# Patient Record
Sex: Female | Born: 1960
Health system: Southern US, Community
[De-identification: ages and names within clinical notes are randomized; demographics above are authoritative.]

## PROBLEM LIST (undated history)

## (undated) DIAGNOSIS — R519 Headache, unspecified: Secondary | ICD-10-CM

## (undated) DIAGNOSIS — E7800 Pure hypercholesterolemia, unspecified: Secondary | ICD-10-CM

## (undated) DIAGNOSIS — G8929 Other chronic pain: Secondary | ICD-10-CM

## (undated) DIAGNOSIS — I729 Aneurysm of unspecified site: Secondary | ICD-10-CM

## (undated) DIAGNOSIS — R51 Headache: Secondary | ICD-10-CM

## (undated) DIAGNOSIS — E119 Type 2 diabetes mellitus without complications: Secondary | ICD-10-CM

## (undated) DIAGNOSIS — I10 Essential (primary) hypertension: Secondary | ICD-10-CM

## (undated) HISTORY — PX: TUBAL LIGATION: SHX77

## (undated) HISTORY — PX: DILATION AND CURETTAGE OF UTERUS: SHX78

---
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
## 1988-09-07 HISTORY — PX: CERVIX SURGERY: SHX593

## 2000-09-07 DIAGNOSIS — I729 Aneurysm of unspecified site: Secondary | ICD-10-CM

## 2000-09-07 HISTORY — DX: Aneurysm of unspecified site: I72.9

## 2000-09-07 HISTORY — PX: CEREBRAL ANEURYSM REPAIR: SHX164

## 2001-03-21 ENCOUNTER — Emergency Department (HOSPITAL_COMMUNITY): Admission: EM | Admit: 2001-03-21 | Discharge: 2001-03-22 | Payer: Self-pay | Admitting: *Deleted

## 2001-03-22 ENCOUNTER — Encounter: Payer: Self-pay | Admitting: *Deleted

## 2001-03-24 ENCOUNTER — Encounter: Payer: Self-pay | Admitting: Neurosurgery

## 2001-03-24 ENCOUNTER — Ambulatory Visit (HOSPITAL_COMMUNITY): Admission: RE | Admit: 2001-03-24 | Discharge: 2001-03-24 | Payer: Self-pay | Admitting: Neurosurgery

## 2001-03-30 ENCOUNTER — Ambulatory Visit (HOSPITAL_COMMUNITY): Admission: RE | Admit: 2001-03-30 | Discharge: 2001-03-30 | Payer: Self-pay | Admitting: Neurosurgery

## 2001-03-30 ENCOUNTER — Encounter: Payer: Self-pay | Admitting: Neurosurgery

## 2001-06-03 ENCOUNTER — Emergency Department (HOSPITAL_COMMUNITY): Admission: EM | Admit: 2001-06-03 | Discharge: 2001-06-04 | Payer: Self-pay | Admitting: *Deleted

## 2001-06-04 ENCOUNTER — Encounter: Payer: Self-pay | Admitting: *Deleted

## 2002-05-22 ENCOUNTER — Emergency Department (HOSPITAL_COMMUNITY): Admission: EM | Admit: 2002-05-22 | Discharge: 2002-05-22 | Payer: Self-pay | Admitting: Emergency Medicine

## 2002-05-22 ENCOUNTER — Encounter: Payer: Self-pay | Admitting: Emergency Medicine

## 2003-03-01 ENCOUNTER — Emergency Department (HOSPITAL_COMMUNITY): Admission: EM | Admit: 2003-03-01 | Discharge: 2003-03-01 | Payer: Self-pay | Admitting: Emergency Medicine

## 2003-09-08 ENCOUNTER — Emergency Department (HOSPITAL_COMMUNITY): Admission: EM | Admit: 2003-09-08 | Discharge: 2003-09-08 | Payer: Self-pay | Admitting: Emergency Medicine

## 2007-02-07 ENCOUNTER — Emergency Department (HOSPITAL_COMMUNITY): Admission: EM | Admit: 2007-02-07 | Discharge: 2007-02-07 | Payer: Self-pay | Admitting: Emergency Medicine

## 2007-04-22 ENCOUNTER — Emergency Department (HOSPITAL_COMMUNITY): Admission: EM | Admit: 2007-04-22 | Discharge: 2007-04-22 | Payer: Self-pay | Admitting: Emergency Medicine

## 2007-06-19 ENCOUNTER — Emergency Department (HOSPITAL_COMMUNITY): Admission: EM | Admit: 2007-06-19 | Discharge: 2007-06-19 | Payer: Self-pay | Admitting: Emergency Medicine

## 2007-09-08 ENCOUNTER — Emergency Department (HOSPITAL_COMMUNITY): Admission: EM | Admit: 2007-09-08 | Discharge: 2007-09-08 | Payer: Self-pay | Admitting: Emergency Medicine

## 2009-06-22 ENCOUNTER — Emergency Department (HOSPITAL_COMMUNITY): Admission: EM | Admit: 2009-06-22 | Discharge: 2009-06-22 | Payer: Self-pay | Admitting: Emergency Medicine

## 2010-06-16 ENCOUNTER — Emergency Department (HOSPITAL_COMMUNITY): Admission: EM | Admit: 2010-06-16 | Discharge: 2010-06-16 | Payer: Self-pay | Admitting: Emergency Medicine

## 2010-09-28 ENCOUNTER — Encounter: Payer: Self-pay | Admitting: Family Medicine

## 2010-11-19 LAB — CBC
HCT: 36.9 % (ref 36.0–46.0)
Hemoglobin: 12.4 g/dL (ref 12.0–15.0)
MCH: 27.1 pg (ref 26.0–34.0)
MCHC: 33.5 g/dL (ref 30.0–36.0)
MCV: 80.8 fL (ref 78.0–100.0)
Platelets: 238 10*3/uL (ref 150–400)
RBC: 4.57 MIL/uL (ref 3.87–5.11)
RDW: 15.1 % (ref 11.5–15.5)
WBC: 9.1 10*3/uL (ref 4.0–10.5)

## 2010-11-19 LAB — POCT CARDIAC MARKERS
CKMB, poc: <1 ng/mL — ABNORMAL LOW (ref 1.0–8.0)
Myoglobin, poc: 51.8 ng/mL (ref 12–200)
Troponin i, poc: <0.05 ng/mL (ref 0.00–0.09)

## 2010-11-19 LAB — DIFFERENTIAL
Basophils Absolute: 0 10*3/uL (ref 0.0–0.1)
Basophils Relative: 0 % (ref 0–1)
Eosinophils Absolute: 0.2 10*3/uL (ref 0.0–0.7)
Eosinophils Relative: 2 % (ref 0–5)
Lymphocytes Relative: 42 % (ref 12–46)
Lymphs Abs: 3.8 10*3/uL (ref 0.7–4.0)
Monocytes Absolute: 0.4 10*3/uL (ref 0.1–1.0)
Monocytes Relative: 5 % (ref 3–12)
Neutro Abs: 4.7 10*3/uL (ref 1.7–7.7)
Neutrophils Relative %: 51 % (ref 43–77)

## 2010-11-19 LAB — COMPREHENSIVE METABOLIC PANEL
ALT: 13 U/L (ref 0–35)
AST: 18 U/L (ref 0–37)
Albumin: 3.8 g/dL (ref 3.5–5.2)
Alkaline Phosphatase: 57 U/L (ref 39–117)
BUN: 7 mg/dL (ref 6–23)
CO2: 27 mEq/L (ref 19–32)
Calcium: 9.4 mg/dL (ref 8.4–10.5)
Chloride: 105 mEq/L (ref 96–112)
Creatinine, Ser: 0.83 mg/dL (ref 0.4–1.2)
GFR calc non Af Amer: >60 mL/min (ref >60)
Glucose, Bld: 117 mg/dL — ABNORMAL HIGH (ref 70–99)
Potassium: 3.4 mEq/L — ABNORMAL LOW (ref 3.5–5.1)
Sodium: 139 mEq/L (ref 135–145)
Total Bilirubin: 0.3 mg/dL (ref 0.3–1.2)
Total Protein: 7.1 g/dL (ref 6.0–8.3)

## 2010-11-19 LAB — LIPASE, BLOOD: Lipase: 45 U/L (ref 11–59)

## 2011-07-21 ENCOUNTER — Emergency Department (HOSPITAL_COMMUNITY): Payer: BC Managed Care – PPO

## 2011-07-21 ENCOUNTER — Emergency Department (HOSPITAL_COMMUNITY)
Admission: EM | Admit: 2011-07-21 | Discharge: 2011-07-22 | Disposition: A | Discharge status: Home / Self Care | Payer: BC Managed Care – PPO | Attending: Emergency Medicine | Admitting: Emergency Medicine

## 2011-07-21 ENCOUNTER — Other Ambulatory Visit: Payer: Self-pay

## 2011-07-21 ENCOUNTER — Encounter: Payer: Self-pay | Admitting: *Deleted

## 2011-07-21 DIAGNOSIS — Z79899 Other long term (current) drug therapy: Secondary | ICD-10-CM | POA: Insufficient documentation

## 2011-07-21 DIAGNOSIS — R05 Cough: Secondary | ICD-10-CM | POA: Insufficient documentation

## 2011-07-21 DIAGNOSIS — R51 Headache: Secondary | ICD-10-CM

## 2011-07-21 DIAGNOSIS — I10 Essential (primary) hypertension: Secondary | ICD-10-CM | POA: Insufficient documentation

## 2011-07-21 DIAGNOSIS — R059 Cough, unspecified: Secondary | ICD-10-CM | POA: Insufficient documentation

## 2011-07-21 DIAGNOSIS — R079 Chest pain, unspecified: Secondary | ICD-10-CM | POA: Insufficient documentation

## 2011-07-21 DIAGNOSIS — R42 Dizziness and giddiness: Secondary | ICD-10-CM | POA: Insufficient documentation

## 2011-07-21 HISTORY — DX: Aneurysm of unspecified site: I72.9

## 2011-07-21 HISTORY — DX: Essential (primary) hypertension: I10

## 2011-07-21 LAB — CBC
HCT: 36 % (ref 36.0–46.0)
Hemoglobin: 11.9 g/dL — ABNORMAL LOW (ref 12.0–15.0)
MCH: 26.6 pg (ref 26.0–34.0)
MCHC: 33.1 g/dL (ref 30.0–36.0)
MCV: 80.5 fL (ref 78.0–100.0)
Platelets: 278 10*3/uL (ref 150–400)
RBC: 4.47 MIL/uL (ref 3.87–5.11)
RDW: 14.5 % (ref 11.5–15.5)
WBC: 8.8 10*3/uL (ref 4.0–10.5)

## 2011-07-21 LAB — POCT I-STAT TROPONIN I: Troponin i, poc: 0 ng/mL (ref 0.00–0.08)

## 2011-07-21 LAB — HEPATIC FUNCTION PANEL
ALT: 9 U/L (ref 0–35)
AST: 18 U/L (ref 0–37)
Albumin: 3.6 g/dL (ref 3.5–5.2)
Alkaline Phosphatase: 77 U/L (ref 39–117)
Bilirubin, Direct: <0.1 mg/dL (ref 0.0–0.3)
Total Bilirubin: 0.2 mg/dL — ABNORMAL LOW (ref 0.3–1.2)
Total Protein: 7.5 g/dL (ref 6.0–8.3)

## 2011-07-21 LAB — BASIC METABOLIC PANEL
BUN: 11 mg/dL (ref 6–23)
CO2: 27 mEq/L (ref 19–32)
Calcium: 9.8 mg/dL (ref 8.4–10.5)
Chloride: 108 mEq/L (ref 96–112)
Creatinine, Ser: 0.78 mg/dL (ref 0.50–1.10)
GFR calc Af Amer: >90 mL/min (ref >90)
GFR calc non Af Amer: >90 mL/min (ref >90)
Glucose, Bld: 107 mg/dL — ABNORMAL HIGH (ref 70–99)
Potassium: 4.3 mEq/L (ref 3.5–5.1)
Sodium: 143 mEq/L (ref 135–145)

## 2011-07-21 MED ORDER — DIPHENHYDRAMINE HCL 25 MG PO CAPS
50.0000 mg | ORAL_CAPSULE | Freq: Once | ORAL | Status: AC
  Administered 2011-07-21: 50 mg via ORAL
  Filled 2011-07-21: qty 1

## 2011-07-21 MED ORDER — IBUPROFEN 200 MG PO TABS
600.0000 mg | ORAL_TABLET | Freq: Once | ORAL | Status: AC
  Administered 2011-07-21: 600 mg via ORAL
  Filled 2011-07-21: qty 3

## 2011-07-21 MED ORDER — METOCLOPRAMIDE HCL 10 MG PO TABS
10.0000 mg | ORAL_TABLET | ORAL | Status: AC
  Administered 2011-07-21: 10 mg via ORAL
  Filled 2011-07-21: qty 1

## 2011-07-21 NOTE — ED Notes (Signed)
To ed for eval of left side cp for the past cple of weeks. Poor historian. States it started as a sharp pain and now feels like a 'brick'. C/o productive cough. No fevers. Skin w/d, resp e/u. No difficulty with sleeping at night.

## 2011-07-21 NOTE — ED Provider Notes (Signed)
History     CSN: [id number] Arrival date & time: 07/21/2011  2:02 PM   First MD Initiated Contact with Patient 07/21/11 1832      Chief Complaint  Patient presents with  . Chest Pain    (Consider location/radiation/quality/duration/timing/severity/associated sxs/prior treatment) HPI Comments: Patient presented to the ED for chief complaint of left-sided chest pain that been going on for 2 weeks. She states the pain is sharp. She also complains of some lightheadedness and headache.. She denies any change in her vision, new onset of weakness, shortness of breath, DOE, PND, orthopnea. Patient denies hemoptysis, recent travel, recent surgery, and estrogen use.  The history is provided by the patient.    Past Medical History  Diagnosis Date  . Hypertension     History reviewed. No pertinent past surgical history.  History reviewed. No pertinent family history.  History  Substance Use Topics  . Smoking status: Not on file  . Smokeless tobacco: Not on file  . Alcohol Use: No    OB History    Grav Para Term Preterm Abortions TAB SAB Ect Mult Living                  Review of Systems  Constitutional: Negative for fever, chills and appetite change.  HENT: Negative for congestion, neck pain, neck stiffness and sinus pressure.   Eyes: Negative for photophobia, pain and visual disturbance.  Respiratory: Positive for cough. Negative for apnea, chest tightness, shortness of breath, wheezing and stridor.   Cardiovascular: Negative for chest pain, palpitations and leg swelling.  Gastrointestinal: Negative for nausea and abdominal pain.  Genitourinary: Negative for dysuria, urgency and frequency.  Skin: Negative for rash.  Neurological: Positive for light-headedness and headaches. Negative for dizziness, seizures, syncope, facial asymmetry, speech difficulty, weakness and numbness.  Psychiatric/Behavioral: Negative for confusion.    Allergies  Review of patient's allergies  indicates no known allergies.  Home Medications   Current Outpatient Rx  Name Route Sig Dispense Refill  . LISINOPRIL 20 MG PO TABS Oral Take 20 mg by mouth daily.        BP 157/117  Pulse 91  Temp(Src) 98.2 F (36.8 C) (Oral)  Resp 16  SpO2 98%  Physical Exam  Constitutional: She is oriented to person, place, and time. She appears well-developed and well-nourished. No distress.  HENT:  Head: Normocephalic and atraumatic.  Mouth/Throat: Oropharynx is clear and moist. No oropharyngeal exudate.  Eyes: Conjunctivae and EOM are normal. Pupils are equal, round, and reactive to light. No scleral icterus.  Neck: Normal range of motion. Neck supple. No tracheal deviation present. No thyromegaly present.  Cardiovascular: Normal rate, regular rhythm, normal heart sounds and intact distal pulses.   Pulmonary/Chest: Effort normal and breath sounds normal. No stridor.  Abdominal: Soft. Bowel sounds are normal.  Musculoskeletal: Normal range of motion. She exhibits no edema and no tenderness.  Neurological: She is alert and oriented to person, place, and time. She has normal strength and normal reflexes. She displays no atrophy and no tremor. No cranial nerve deficit or sensory deficit. She exhibits normal muscle tone. She displays a negative Romberg sign. She displays no seizure activity. Coordination normal.  Skin: Skin is warm and dry. No rash noted. She is not diaphoretic. No erythema. No pallor.  Psychiatric: She has a normal mood and affect. Her behavior is normal.    ED Course  Procedures (including critical care time)  Labs Reviewed  CBC - Abnormal; Notable for the following:  Hemoglobin 11.9 (*)    All other components within normal limits  BASIC METABOLIC PANEL - Abnormal; Notable for the following:    Glucose, Bld 107 (*)    All other components within normal limits  POCT I-STAT TROPONIN I  I-STAT TROPONIN I  HEPATIC FUNCTION PANEL   Dg Chest 2 View  07/21/2011   *RADIOLOGY REPORT*  Clinical Data: Left-sided chest pain  CHEST - 2 VIEW  Comparison: Chest x-ray of 06/22/2009, and chest x-ray of 09/08/2007  Findings: No active infiltrate or effusion is seen. The nodular density overlying the anterior left first costochondral junction appears stable and bony in origin.  Mediastinal contours are stable.  The heart is within normal limits in size.  No bony abnormality seen.  IMPRESSION: No active lung disease.  Original Report Authenticated By: Juline Patch, M.D.     No diagnosis found. Medical screening exam. Patient has had chest pain for a couple weeks. It comes and goes. She's also had occasional cough. She also has right upper quadrant abdominal pain. She does not know if the abdominal pain is worse with food or not. She also has a headache. She states it is right-sided headache. It is not her typical headache. She states she's had headaches on and off since an aneurysm in 2002. sHe states it was fixed at [facility]. It is not a severe headache.   Date: 07/21/2011  Rate:84  Rhythm: normal sinus rhythm  QRS Axis: normal  Intervals: normal  ST/T Wave abnormalities: normal  Conduction Disutrbances:none  Narrative Interpretation:   Old EKG Reviewed: none available  Of note patient care was assumed from Dr. Rubin Payor. Patient was already in the emergency department for 7 before she was handed over to my care. Patient currently does have frustrations with her time in the ED however she is not currently agitated.  10:16 PM  CT IMPRESSION:  Stable sequelae of ACA region aneurysm clipping. Stable and  otherwise negative noncontrast CT appearance of the head.  Original Report Authenticated By: Harley Hallmark, M.D.  Patient results discussed with Dr. Clarene Duke. She agrees with the plan to discharge patient with followup. Of note the patient was able to and around the emergency department with a normal gait. She states she feels slightly lightheaded but understands  her results were all negative and she will followup with her primary care doctor.   MDM  Headache Light headedness Smoking cessation          [place], [place] 07/22/11 0023

## 2011-07-21 NOTE — ED Notes (Signed)
Pt given sprite soda and crakers

## 2011-07-22 LAB — D-DIMER, QUANTITATIVE: D-Dimer, Quant: 0.48 ug/mL-FEU (ref 0.00–0.48)

## 2011-07-22 NOTE — ED Provider Notes (Signed)
Medical screening examination/treatment/procedure(s) were performed by non-physician practitioner and as supervising physician I was immediately available for consultation/collaboration.   Tenea Sens M Dianna Ewald, DO 07/22/11 0043 

## 2011-12-05 ENCOUNTER — Encounter (HOSPITAL_COMMUNITY): Payer: Self-pay | Admitting: *Deleted

## 2011-12-05 ENCOUNTER — Emergency Department (HOSPITAL_COMMUNITY)
Admission: EM | Admit: 2011-12-05 | Discharge: 2011-12-05 | Disposition: A | Discharge status: Home / Self Care | Payer: BC Managed Care – PPO | Attending: Emergency Medicine | Admitting: Emergency Medicine

## 2011-12-05 DIAGNOSIS — Z79899 Other long term (current) drug therapy: Secondary | ICD-10-CM | POA: Insufficient documentation

## 2011-12-05 DIAGNOSIS — R51 Headache: Secondary | ICD-10-CM | POA: Insufficient documentation

## 2011-12-05 DIAGNOSIS — I10 Essential (primary) hypertension: Secondary | ICD-10-CM | POA: Insufficient documentation

## 2011-12-05 LAB — COMPREHENSIVE METABOLIC PANEL
ALT: 11 U/L (ref 0–35)
AST: 14 U/L (ref 0–37)
Albumin: 3.9 g/dL (ref 3.5–5.2)
Alkaline Phosphatase: 80 U/L (ref 39–117)
BUN: 10 mg/dL (ref 6–23)
CO2: 27 mEq/L (ref 19–32)
Calcium: 9.9 mg/dL (ref 8.4–10.5)
Chloride: 103 mEq/L (ref 96–112)
Creatinine, Ser: 0.82 mg/dL (ref 0.50–1.10)
GFR calc Af Amer: >90 mL/min (ref >90)
GFR calc non Af Amer: 83 mL/min — ABNORMAL LOW (ref >90)
Glucose, Bld: 127 mg/dL — ABNORMAL HIGH (ref 70–99)
Potassium: 3.6 mEq/L (ref 3.5–5.1)
Sodium: 139 mEq/L (ref 135–145)
Total Bilirubin: 0.2 mg/dL — ABNORMAL LOW (ref 0.3–1.2)
Total Protein: 7.7 g/dL (ref 6.0–8.3)

## 2011-12-05 LAB — DIFFERENTIAL
Basophils Absolute: 0 10*3/uL (ref 0.0–0.1)
Basophils Relative: 0 % (ref 0–1)
Eosinophils Absolute: 0.1 10*3/uL (ref 0.0–0.7)
Eosinophils Relative: 1 % (ref 0–5)
Lymphocytes Relative: 31 % (ref 12–46)
Lymphs Abs: 3 10*3/uL (ref 0.7–4.0)
Monocytes Absolute: 0.4 10*3/uL (ref 0.1–1.0)
Monocytes Relative: 4 % (ref 3–12)
Neutro Abs: 6.1 10*3/uL (ref 1.7–7.7)
Neutrophils Relative %: 63 % (ref 43–77)

## 2011-12-05 LAB — CBC
HCT: 37.5 % (ref 36.0–46.0)
Hemoglobin: 12.4 g/dL (ref 12.0–15.0)
MCH: 26.3 pg (ref 26.0–34.0)
MCHC: 33.1 g/dL (ref 30.0–36.0)
MCV: 79.6 fL (ref 78.0–100.0)
Platelets: 234 10*3/uL (ref 150–400)
RBC: 4.71 MIL/uL (ref 3.87–5.11)
RDW: 14.9 % (ref 11.5–15.5)
WBC: 9.6 10*3/uL (ref 4.0–10.5)

## 2011-12-05 LAB — LIPASE, BLOOD: Lipase: 38 U/L (ref 11–59)

## 2011-12-05 MED ORDER — RANITIDINE HCL 150 MG PO CAPS: 150.0000 mg | ORAL_CAPSULE | Freq: Two times a day (BID) | ORAL | Status: DC

## 2011-12-05 MED ORDER — PROMETHAZINE HCL 25 MG PO TABS: 25.0000 mg | ORAL_TABLET | Freq: Four times a day (QID) | ORAL | Status: DC | PRN

## 2011-12-05 MED ORDER — OXYCODONE-ACETAMINOPHEN 5-325 MG PO TABS: 1.0000 | ORAL_TABLET | Freq: Four times a day (QID) | ORAL | Status: AC | PRN

## 2011-12-05 MED ORDER — HYDROMORPHONE HCL PF 1 MG/ML IJ SOLN
1.0000 mg | Freq: Once | INTRAMUSCULAR | Status: AC
  Administered 2011-12-05: 1 mg via INTRAVENOUS
  Filled 2011-12-05: qty 1

## 2011-12-05 MED ORDER — ONDANSETRON HCL 4 MG/2ML IJ SOLN
4.0000 mg | Freq: Once | INTRAMUSCULAR | Status: AC
  Administered 2011-12-05: 4 mg via INTRAVENOUS
  Filled 2011-12-05: qty 2

## 2011-12-05 MED ORDER — SODIUM CHLORIDE 0.9 % IV SOLN
Freq: Once | INTRAVENOUS | Status: AC
  Administered 2011-12-05: 14:00:00 via INTRAVENOUS

## 2011-12-05 NOTE — Discharge Instructions (Signed)
Follow up next week if not improving.  Drink plenty of fluids °

## 2011-12-05 NOTE — ED Notes (Signed)
MD at bedside. 

## 2011-12-05 NOTE — ED Notes (Signed)
Pt c/o headache, nausea, upper abdominal pain and dizziness since last night. Denies vomiting, diarrhea or fever.

## 2011-12-05 NOTE — ED Provider Notes (Signed)
History     CSN: [id number]  Arrival date & time 12/05/11  1248   First MD Initiated Contact with Patient 12/05/11 1307      Chief Complaint  Patient presents with  . Headache    (Consider location/radiation/quality/duration/timing/severity/associated sxs/prior treatment) Patient is a 51 y.o. female presenting with headaches. The history is provided by the patient (The patient complains of a headache nausea and some epigastric pain. This has been going on for one to 2 days.). No language interpreter was used.  Headache  This is a new problem. The current episode started 6 to 12 hours ago. The problem occurs constantly. The problem has not changed since onset.The headache is associated with nothing. The pain is located in the bilateral region. The quality of the pain is described as dull. The pain is at a severity of 3/10. The pain is moderate. The pain does not radiate. Associated symptoms include anorexia and nausea. The treatment provided moderate relief.    Past Medical History  Diagnosis Date  . Hypertension   . Aneurysm 07/21/11    coil / clip    Past Surgical History  Procedure Date  . Cerebral aneurysm repair     History reviewed. No pertinent family history.  History  Substance Use Topics  . Smoking status: Current Everyday Smoker -- 0.5 packs/day    Types: Cigarettes  . Smokeless tobacco: Not on file  . Alcohol Use: No    OB History    Grav Para Term Preterm Abortions TAB SAB Ect Mult Living                  Review of Systems  Constitutional: Negative for fatigue.  HENT: Negative for congestion, sinus pressure and ear discharge.   Eyes: Negative for discharge.  Respiratory: Negative for cough.   Cardiovascular: Negative for chest pain.  Gastrointestinal: Positive for nausea, abdominal pain and anorexia. Negative for diarrhea.  Genitourinary: Negative for frequency and hematuria.  Musculoskeletal: Negative for back pain.  Skin: Negative for rash.    Neurological: Positive for headaches. Negative for seizures.  Hematological: Negative.   Psychiatric/Behavioral: Negative for hallucinations.    Allergies  Review of patient's allergies indicates no known allergies.  Home Medications   Current Outpatient Rx  Name Route Sig Dispense Refill  . DIPHENHYDRAMINE-APAP (SLEEP) 25-500 MG PO TABS Oral Take 2 tablets by mouth at bedtime as needed. For pain    . GENISTEIN 30 MG PO TABS Oral Take 1 tablet by mouth as needed. I-Cool.  Takes occasionally for hot flashes    . LISINOPRIL 20 MG PO TABS Oral Take 20 mg by mouth daily.      Marland Kitchen OVER THE COUNTER MEDICATION Oral Take 1 tablet by mouth as needed. OTC medication: Move Right for joint pain    . OXYCODONE-ACETAMINOPHEN 5-325 MG PO TABS Oral Take 1 tablet by mouth every 6 (six) hours as needed for pain. 15 tablet 0  . PROMETHAZINE HCL 25 MG PO TABS Oral Take 1 tablet (25 mg total) by mouth every 6 (six) hours as needed for nausea. 15 tablet 0  . RANITIDINE HCL 150 MG PO CAPS Oral Take 1 capsule (150 mg total) by mouth 2 (two) times daily. 30 capsule 0    BP 146/94  Pulse 102  Temp(Src) 98.9 F (37.2 C) (Oral)  Resp 16  Ht 5' 7.5" (1.715 m)  Wt 210 lb (95.255 kg)  BMI 32.41 kg/m2  SpO2 99%  Physical Exam  Constitutional: She is  oriented to person, place, and time. She appears well-developed.  HENT:  Head: Normocephalic and atraumatic.  Eyes: Conjunctivae and EOM are normal. No scleral icterus.  Neck: Neck supple. No thyromegaly present.  Cardiovascular: Normal rate and regular rhythm.  Exam reveals no gallop and no friction rub.   No murmur heard. Pulmonary/Chest: No stridor. She has no wheezes. She has no rales. She exhibits no tenderness.  Abdominal: She exhibits no distension. There is tenderness. There is no rebound.       Mild tendernous epigastric  Musculoskeletal: Normal range of motion. She exhibits no edema.  Lymphadenopathy:    She has no cervical adenopathy.   Neurological: She is oriented to person, place, and time. Coordination normal.  Skin: No rash noted. No erythema.  Psychiatric: She has a normal mood and affect. Her behavior is normal.    ED Course  Procedures (including critical care time)  Labs Reviewed  COMPREHENSIVE METABOLIC PANEL - Abnormal; Notable for the following:    Glucose, Bld 127 (*)    Total Bilirubin 0.2 (*)    GFR calc non Af Amer 83 (*)    All other components within normal limits  CBC  DIFFERENTIAL  LIPASE, BLOOD   No results found.   1. Headache       MDM  Headache and nausea  Possible viral syndrome.  Pt to follow up this week        Benny Lennert, MD 12/05/11 1546

## 2011-12-05 NOTE — ED Notes (Signed)
Pt Dc to home with steady gait. 

## 2012-01-13 ENCOUNTER — Encounter (HOSPITAL_COMMUNITY): Payer: Self-pay

## 2012-01-13 ENCOUNTER — Encounter (HOSPITAL_COMMUNITY)
Admission: RE | Admit: 2012-01-13 | Discharge: 2012-01-13 | Disposition: A | Discharge status: Home / Self Care | Payer: BC Managed Care – PPO | Source: Ambulatory Visit | Attending: General Surgery | Admitting: General Surgery

## 2012-01-13 LAB — CBC
HCT: 37 % (ref 36.0–46.0)
Hemoglobin: 11.9 g/dL — ABNORMAL LOW (ref 12.0–15.0)
MCH: 26.3 pg (ref 26.0–34.0)
MCHC: 32.2 g/dL (ref 30.0–36.0)
MCV: 81.7 fL (ref 78.0–100.0)
Platelets: 269 10*3/uL (ref 150–400)
RBC: 4.53 MIL/uL (ref 3.87–5.11)
RDW: 15.1 % (ref 11.5–15.5)
WBC: 6.3 10*3/uL (ref 4.0–10.5)

## 2012-01-13 LAB — DIFFERENTIAL
Basophils Absolute: 0 10*3/uL (ref 0.0–0.1)
Basophils Relative: 0 % (ref 0–1)
Eosinophils Absolute: 0.2 10*3/uL (ref 0.0–0.7)
Eosinophils Relative: 4 % (ref 0–5)
Lymphocytes Relative: 58 % — ABNORMAL HIGH (ref 12–46)
Lymphs Abs: 3.6 10*3/uL (ref 0.7–4.0)
Monocytes Absolute: 0.2 10*3/uL (ref 0.1–1.0)
Monocytes Relative: 4 % (ref 3–12)
Neutro Abs: 2.2 10*3/uL (ref 1.7–7.7)
Neutrophils Relative %: 34 % — ABNORMAL LOW (ref 43–77)

## 2012-01-13 LAB — BASIC METABOLIC PANEL
BUN: 7 mg/dL (ref 6–23)
CO2: 27 mEq/L (ref 19–32)
Calcium: 9.8 mg/dL (ref 8.4–10.5)
Chloride: 106 mEq/L (ref 96–112)
Creatinine, Ser: 0.73 mg/dL (ref 0.50–1.10)
GFR calc Af Amer: >90 mL/min (ref >90)
GFR calc non Af Amer: >90 mL/min (ref >90)
Glucose, Bld: 137 mg/dL — ABNORMAL HIGH (ref 70–99)
Potassium: 4 mEq/L (ref 3.5–5.1)
Sodium: 142 mEq/L (ref 135–145)

## 2012-01-13 LAB — SURGICAL PCR SCREEN
MRSA, PCR: NEGATIVE
Staphylococcus aureus: NEGATIVE

## 2012-01-13 LAB — HCG, SERUM, QUALITATIVE: Preg, Serum: NEGATIVE

## 2012-01-13 NOTE — Patient Instructions (Signed)
20 Denise Hill  01/13/2012   Your procedure is scheduled on:  01/18/2012  Report to [facility] at  900  AM.  Call this number if you have problems the morning of surgery: [phone]   Remember:   Do not eat food:After Midnight.  May have clear liquids:until Midnight .  Clear liquids include soda, tea, black coffee, apple or grape juice, broth.  Take these medicines the morning of surgery with A SIP OF WATER:  Lisinopril,zantac,phenergan   Do not wear jewelry, make-up or nail polish.  Do not wear lotions, powders, or perfumes. You may wear deodorant.  Do not shave 48 hours prior to surgery.  Do not bring valuables to the hospital.  Contacts, dentures or bridgework may not be worn into surgery.  Leave suitcase in the car. After surgery it may be brought to your room.  For patients admitted to the hospital, checkout time is 11:00 AM the day of discharge.   Patients discharged the day of surgery will not be allowed to drive home.  Name and phone number of your driver: family  Special Instructions: CHG Shower Use Special Wash: 1/2 bottle night before surgery and 1/2 bottle morning of surgery.   Please read over the following fact sheets that you were given: Pain Booklet, MRSA Information, Surgical Site Infection Prevention, Anesthesia Post-op Instructions and Care and Recovery After Surgery Laparoscopic Cholecystectomy Laparoscopic cholecystectomy is surgery to remove the gallbladder. The gallbladder is located slightly to the right of center in the abdomen, behind the liver. It is a concentrating and storage sac for the bile produced in the liver. Bile aids in the digestion and absorption of fats. Gallbladder disease (cholecystitis) is an inflammation of your gallbladder. This condition is usually caused by a buildup of gallstones (cholelithiasis) in your gallbladder. Gallstones can block the flow of bile, resulting in inflammation and pain. In severe cases, emergency surgery may be required.  When emergency surgery is not required, you will have time to prepare for the procedure. Laparoscopic surgery is an alternative to open surgery. Laparoscopic surgery usually has a shorter recovery time. Your common bile duct may also need to be examined and explored. Your caregiver will discuss this with you if he or she feels this should be done. If stones are found in the common bile duct, they may be removed. LET YOUR CAREGIVER KNOW ABOUT:  Allergies to food or medicine.   Medicines taken, including vitamins, herbs, eyedrops, over-the-counter medicines, and creams.   Use of steroids (by mouth or creams).   Previous problems with anesthetics or numbing medicines.   History of bleeding problems or blood clots.   Previous surgery.   Other health problems, including diabetes and kidney problems.   Possibility of pregnancy, if this applies.  RISKS AND COMPLICATIONS All surgery is associated with risks. Some problems that may occur following this procedure include:  Infection.   Damage to the common bile duct, nerves, arteries, veins, or other internal organs such as the stomach or intestines.   Bleeding.   A stone may remain in the common bile duct.  BEFORE THE PROCEDURE  Do not take aspirin for 3 days prior to surgery or blood thinners for 1 week prior to surgery.   Do not eat or drink anything after midnight the night before surgery.   Let your caregiver know if you develop a cold or other infectious problem prior to surgery.   You should be present 60 minutes before the procedure or as directed.  PROCEDURE  You will be given medicine that makes you sleep (general anesthetic). When you are asleep, your surgeon will make several small cuts (incisions) in your abdomen. One of these incisions is used to insert a small, lighted scope (laparoscope) into the abdomen. The laparoscope helps the surgeon see into your abdomen. Carbon dioxide gas will be pumped into your abdomen. The gas  allows more room for the surgeon to perform your surgery. Other operating instruments are inserted through the other incisions. Laparoscopic procedures may not be appropriate when:  There is major scarring from previous surgery.   The gallbladder is extremely inflamed.   There are bleeding disorders or unexpected cirrhosis of the liver.   A pregnancy is near term.   Other conditions make the laparoscopic procedure impossible.  If your surgeon feels it is not safe to continue with a laparoscopic procedure, he or she will perform an open abdominal procedure. In this case, the surgeon will make an incision to open the abdomen. This gives the surgeon a larger view and field to work within. This may allow the surgeon to perform procedures that sometimes cannot be performed with a laparoscope alone. Open surgery has a longer recovery time. AFTER THE PROCEDURE  You will be taken to the recovery area where a nurse will watch and check your progress.   You may be allowed to go home the same day.   Do not resume physical activities until directed by your caregiver.   You may resume a normal diet and activities as directed.  Document Released: 08/24/2005 Document Revised: 08/13/2011 Document Reviewed: 02/06/2011 [facility] Patient Information 2012 [place], [place].PATIENT INSTRUCTIONS POST-ANESTHESIA  IMMEDIATELY FOLLOWING SURGERY:  Do not drive or operate machinery for the first twenty four hours after surgery.  Do not make any important decisions for twenty four hours after surgery or while taking narcotic pain medications or sedatives.  If you develop intractable nausea and vomiting or a severe headache please notify your doctor immediately.  FOLLOW-UP:  Please make an appointment with your surgeon as instructed. You do not need to follow up with anesthesia unless specifically instructed to do so.  WOUND CARE INSTRUCTIONS (if applicable):  Keep a dry clean dressing on the anesthesia/puncture wound  site if there is drainage.  Once the wound has quit draining you may leave it open to air.  Generally you should leave the bandage intact for twenty four hours unless there is drainage.  If the epidural site drains for more than 36-48 hours please call the anesthesia department.  QUESTIONS?:  Please feel free to call your physician or the hospital operator if you have any questions, and they will be happy to assist you.     [facility] Anesthesia Department [address] [place] [phone]

## 2012-01-18 ENCOUNTER — Ambulatory Visit (HOSPITAL_COMMUNITY): Payer: BC Managed Care – PPO | Admitting: Anesthesiology

## 2012-01-18 ENCOUNTER — Encounter (HOSPITAL_COMMUNITY)
Admission: RE | Disposition: A | Discharge status: Home / Self Care | Payer: Self-pay | Source: Ambulatory Visit | Attending: General Surgery

## 2012-01-18 ENCOUNTER — Encounter (HOSPITAL_COMMUNITY): Payer: Self-pay | Admitting: Anesthesiology

## 2012-01-18 ENCOUNTER — Ambulatory Visit (HOSPITAL_COMMUNITY)
Admission: RE | Admit: 2012-01-18 | Discharge: 2012-01-18 | Disposition: A | Discharge status: Home / Self Care | Payer: BC Managed Care – PPO | Source: Ambulatory Visit | Attending: General Surgery | Admitting: General Surgery

## 2012-01-18 ENCOUNTER — Encounter (HOSPITAL_COMMUNITY): Payer: Self-pay | Admitting: *Deleted

## 2012-01-18 DIAGNOSIS — Z79899 Other long term (current) drug therapy: Secondary | ICD-10-CM | POA: Insufficient documentation

## 2012-01-18 DIAGNOSIS — K811 Chronic cholecystitis: Secondary | ICD-10-CM | POA: Insufficient documentation

## 2012-01-18 DIAGNOSIS — Z01812 Encounter for preprocedural laboratory examination: Secondary | ICD-10-CM | POA: Insufficient documentation

## 2012-01-18 DIAGNOSIS — Z01810 Encounter for preprocedural cardiovascular examination: Secondary | ICD-10-CM | POA: Insufficient documentation

## 2012-01-18 DIAGNOSIS — I10 Essential (primary) hypertension: Secondary | ICD-10-CM | POA: Insufficient documentation

## 2012-01-18 HISTORY — PX: CHOLECYSTECTOMY: SHX55

## 2012-01-18 LAB — GLUCOSE, CAPILLARY: Glucose-Capillary: 99 mg/dL (ref 70–99)

## 2012-01-18 SURGERY — LAPAROSCOPIC CHOLECYSTECTOMY
Anesthesia: General | Site: Abdomen | Wound class: Contaminated

## 2012-01-18 MED ORDER — BUPIVACAINE HCL (PF) 0.5 % IJ SOLN
INTRAMUSCULAR | Status: AC
  Filled 2012-01-18: qty 30

## 2012-01-18 MED ORDER — CEFAZOLIN SODIUM-DEXTROSE 2-3 GM-% IV SOLR: 2.0000 g | INTRAVENOUS | Status: DC

## 2012-01-18 MED ORDER — GLYCOPYRROLATE 0.2 MG/ML IJ SOLN
INTRAMUSCULAR | Status: DC | PRN
  Administered 2012-01-18: 0.6 mg via INTRAVENOUS

## 2012-01-18 MED ORDER — CEFAZOLIN SODIUM 1-5 GM-% IV SOLN
INTRAVENOUS | Status: AC
  Filled 2012-01-18: qty 50

## 2012-01-18 MED ORDER — FENTANYL CITRATE 0.05 MG/ML IJ SOLN
25.0000 ug | INTRAMUSCULAR | Status: DC | PRN
  Administered 2012-01-18 (×2): 50 ug via INTRAVENOUS

## 2012-01-18 MED ORDER — HYDROCODONE-ACETAMINOPHEN 5-325 MG PO TABS: 1.0000 | ORAL_TABLET | ORAL | Status: AC | PRN

## 2012-01-18 MED ORDER — BUPIVACAINE HCL (PF) 0.5 % IJ SOLN
INTRAMUSCULAR | Status: DC | PRN
  Administered 2012-01-18: 10 mL

## 2012-01-18 MED ORDER — SODIUM CHLORIDE 0.9 % IR SOLN
Status: DC | PRN
  Administered 2012-01-18: 3000 mL

## 2012-01-18 MED ORDER — ROCURONIUM BROMIDE 100 MG/10ML IV SOLN
INTRAVENOUS | Status: DC | PRN
  Administered 2012-01-18: 20 mg via INTRAVENOUS

## 2012-01-18 MED ORDER — CEFAZOLIN SODIUM 1-5 GM-% IV SOLN
INTRAVENOUS | Status: DC | PRN
  Administered 2012-01-18: 2 g via INTRAVENOUS

## 2012-01-18 MED ORDER — ONDANSETRON HCL 4 MG/2ML IJ SOLN
INTRAMUSCULAR | Status: AC
  Administered 2012-01-18: 4 mg via INTRAVENOUS
  Filled 2012-01-18: qty 2

## 2012-01-18 MED ORDER — HEMOSTATIC AGENTS (NO CHARGE) OPTIME
TOPICAL | Status: DC | PRN
  Administered 2012-01-18: 1 via TOPICAL

## 2012-01-18 MED ORDER — CELECOXIB 100 MG PO CAPS
ORAL_CAPSULE | ORAL | Status: AC
  Administered 2012-01-18: 400 mg via ORAL
  Filled 2012-01-18: qty 4

## 2012-01-18 MED ORDER — GLYCOPYRROLATE 0.2 MG/ML IJ SOLN
INTRAMUSCULAR | Status: AC
  Administered 2012-01-18: 0.2 mg via INTRAVENOUS
  Filled 2012-01-18: qty 1

## 2012-01-18 MED ORDER — NEOSTIGMINE METHYLSULFATE 1 MG/ML IJ SOLN
INTRAMUSCULAR | Status: DC | PRN
  Administered 2012-01-18: 4 mg via INTRAVENOUS

## 2012-01-18 MED ORDER — CELECOXIB 100 MG PO CAPS
400.0000 mg | ORAL_CAPSULE | Freq: Every day | ORAL | Status: AC
  Administered 2012-01-18: 400 mg via ORAL

## 2012-01-18 MED ORDER — SUCCINYLCHOLINE CHLORIDE 20 MG/ML IJ SOLN
INTRAMUSCULAR | Status: DC | PRN
  Administered 2012-01-18: 120 mg via INTRAVENOUS

## 2012-01-18 MED ORDER — MIDAZOLAM HCL 2 MG/2ML IJ SOLN
1.0000 mg | INTRAMUSCULAR | Status: DC | PRN
  Administered 2012-01-18: 2 mg via INTRAVENOUS

## 2012-01-18 MED ORDER — ENOXAPARIN SODIUM 40 MG/0.4ML ~~LOC~~ SOLN
40.0000 mg | Freq: Once | SUBCUTANEOUS | Status: AC
  Administered 2012-01-18: 40 mg via SUBCUTANEOUS

## 2012-01-18 MED ORDER — LIDOCAINE HCL (CARDIAC) 10 MG/ML IV SOLN
INTRAVENOUS | Status: DC | PRN
  Administered 2012-01-18: 50 mg via INTRAVENOUS

## 2012-01-18 MED ORDER — ONDANSETRON HCL 4 MG/2ML IJ SOLN
4.0000 mg | Freq: Once | INTRAMUSCULAR | Status: AC | PRN
  Administered 2012-01-18: 4 mg via INTRAVENOUS

## 2012-01-18 MED ORDER — ONDANSETRON HCL 4 MG/2ML IJ SOLN
4.0000 mg | Freq: Once | INTRAMUSCULAR | Status: AC
  Administered 2012-01-18: 4 mg via INTRAVENOUS

## 2012-01-18 MED ORDER — LACTATED RINGERS IV SOLN
INTRAVENOUS | Status: DC | PRN
  Administered 2012-01-18: 12:00:00 via INTRAVENOUS

## 2012-01-18 MED ORDER — SUCCINYLCHOLINE CHLORIDE 20 MG/ML IJ SOLN
INTRAMUSCULAR | Status: AC
  Filled 2012-01-18: qty 1

## 2012-01-18 MED ORDER — ENOXAPARIN SODIUM 40 MG/0.4ML ~~LOC~~ SOLN
SUBCUTANEOUS | Status: AC
  Administered 2012-01-18: 40 mg via SUBCUTANEOUS
  Filled 2012-01-18: qty 0.4

## 2012-01-18 MED ORDER — ROCURONIUM BROMIDE 50 MG/5ML IV SOLN
INTRAVENOUS | Status: AC
  Filled 2012-01-18: qty 1

## 2012-01-18 MED ORDER — MIDAZOLAM HCL 2 MG/2ML IJ SOLN
INTRAMUSCULAR | Status: AC
  Administered 2012-01-18: 2 mg via INTRAVENOUS
  Filled 2012-01-18: qty 2

## 2012-01-18 MED ORDER — FENTANYL CITRATE 0.05 MG/ML IJ SOLN
INTRAMUSCULAR | Status: AC
  Administered 2012-01-18: 50 ug via INTRAVENOUS
  Filled 2012-01-18: qty 2

## 2012-01-18 MED ORDER — FENTANYL CITRATE 0.05 MG/ML IJ SOLN
INTRAMUSCULAR | Status: DC | PRN
  Administered 2012-01-18 (×2): 50 ug via INTRAVENOUS
  Administered 2012-01-18: 100 ug via INTRAVENOUS

## 2012-01-18 MED ORDER — FENTANYL CITRATE 0.05 MG/ML IJ SOLN
INTRAMUSCULAR | Status: AC
  Administered 2012-01-18: 50 ug via INTRAVENOUS
  Filled 2012-01-18: qty 5

## 2012-01-18 MED ORDER — LABETALOL HCL 5 MG/ML IV SOLN
INTRAVENOUS | Status: DC | PRN
  Administered 2012-01-18: 5 mg via INTRAVENOUS

## 2012-01-18 MED ORDER — GLYCOPYRROLATE 0.2 MG/ML IJ SOLN
INTRAMUSCULAR | Status: AC
  Filled 2012-01-18: qty 3

## 2012-01-18 MED ORDER — CEFAZOLIN SODIUM 1 G IJ SOLR
INTRAMUSCULAR | Status: AC
  Filled 2012-01-18: qty 10

## 2012-01-18 MED ORDER — GLYCOPYRROLATE 0.2 MG/ML IJ SOLN
0.2000 mg | Freq: Once | INTRAMUSCULAR | Status: AC
  Administered 2012-01-18: 0.2 mg via INTRAVENOUS

## 2012-01-18 MED ORDER — NEOSTIGMINE METHYLSULFATE 1 MG/ML IJ SOLN
INTRAMUSCULAR | Status: AC
  Filled 2012-01-18: qty 10

## 2012-01-18 MED ORDER — LACTATED RINGERS IV SOLN
INTRAVENOUS | Status: DC
  Administered 2012-01-18: 1000 mL via INTRAVENOUS

## 2012-01-18 MED ORDER — SODIUM CHLORIDE 0.9 % IR SOLN
Status: DC | PRN
  Administered 2012-01-18: 1000 mL

## 2012-01-18 SURGICAL SUPPLY — 42 items
APL SKNCLS STERI-STRIP NONHPOA (GAUZE/BANDAGES/DRESSINGS) ×1
APPLIER CLIP UNV 5X34 EPIX (ENDOMECHANICALS) ×2 IMPLANT
APR XCLPCLP 20M/L UNV 34X5 (ENDOMECHANICALS) ×1
BAG HAMPER (MISCELLANEOUS) ×2 IMPLANT
BAG SPEC RTRVL LRG 6X4 10 (ENDOMECHANICALS) ×1
BENZOIN TINCTURE PRP APPL 2/3 (GAUZE/BANDAGES/DRESSINGS) ×2 IMPLANT
CLOTH BEACON ORANGE TIMEOUT ST (SAFETY) ×2 IMPLANT
COVER LIGHT HANDLE STERIS (MISCELLANEOUS) ×4 IMPLANT
DECANTER SPIKE VIAL GLASS SM (MISCELLANEOUS) ×2 IMPLANT
DEVICE TROCAR PUNCTURE CLOSURE (ENDOMECHANICALS) ×2 IMPLANT
DURAPREP 26ML APPLICATOR (WOUND CARE) ×2 IMPLANT
ELECT REM PT RETURN 9FT ADLT (ELECTROSURGICAL) ×2
ELECTRODE REM PT RTRN 9FT ADLT (ELECTROSURGICAL) ×1 IMPLANT
FILTER SMOKE EVAC LAPAROSHD (FILTER) ×2 IMPLANT
FORMALIN 10 PREFIL 120ML (MISCELLANEOUS) ×2 IMPLANT
GLOVE BIOGEL PI IND STRL 7.5 (GLOVE) ×1 IMPLANT
GLOVE BIOGEL PI INDICATOR 7.5 (GLOVE) ×1
GLOVE ECLIPSE 6.5 STRL STRAW (GLOVE) ×3 IMPLANT
GLOVE ECLIPSE 7.0 STRL STRAW (GLOVE) ×2 IMPLANT
GLOVE EXAM NITRILE MD LF STRL (GLOVE) ×1 IMPLANT
GLOVE INDICATOR 7.0 STRL GRN (GLOVE) ×3 IMPLANT
GOWN STRL REIN XL XLG (GOWN DISPOSABLE) ×7 IMPLANT
HEMOSTAT SNOW SURGICEL 2X4 (HEMOSTASIS) ×2 IMPLANT
INST SET LAPROSCOPIC AP (KITS) ×2 IMPLANT
IV NS IRRIG 3000ML ARTHROMATIC (IV SOLUTION) ×2 IMPLANT
KIT ROOM TURNOVER APOR (KITS) ×2 IMPLANT
MANIFOLD NEPTUNE II (INSTRUMENTS) ×2 IMPLANT
NDL INSUFFLATION 14GA 120MM (NEEDLE) ×1 IMPLANT
NEEDLE INSUFFLATION 14GA 120MM (NEEDLE) ×2 IMPLANT
PACK LAP CHOLE LZT030E (CUSTOM PROCEDURE TRAY) ×2 IMPLANT
PAD ARMBOARD 7.5X6 YLW CONV (MISCELLANEOUS) ×2 IMPLANT
POUCH SPECIMEN RETRIEVAL 10MM (ENDOMECHANICALS) ×2 IMPLANT
SET BASIN LINEN APH (SET/KITS/TRAYS/PACK) ×2 IMPLANT
SET TUBE IRRIG SUCTION NO TIP (IRRIGATION / IRRIGATOR) ×1 IMPLANT
SLEEVE Z-THREAD 5X100MM (TROCAR) ×2 IMPLANT
STRIP CLOSURE SKIN 1/2X4 (GAUZE/BANDAGES/DRESSINGS) ×3 IMPLANT
SUT MNCRL AB 4-0 PS2 18 (SUTURE) ×4 IMPLANT
SUT VIC AB 2-0 CT2 27 (SUTURE) ×4 IMPLANT
TROCAR Z-THRD FIOS HNDL 11X100 (TROCAR) ×2 IMPLANT
TROCAR Z-THREAD FIOS 5X100MM (TROCAR) ×2 IMPLANT
TROCAR Z-THREAD OPTICAL 5X100M (TROCAR) ×4 IMPLANT
WARMER LAPAROSCOPE (MISCELLANEOUS) ×2 IMPLANT

## 2012-01-18 NOTE — Op Note (Signed)
Patient:  Denise Hill  DOB:  November 04, 1960  MRN:  [id number]   Preop Diagnosis:  Chronic cholecystitis comment gallbladder polyp   Postop Diagnosis:  The same  Procedure:  Laparoscopic cholecystectomy  Surgeon:  Dr. Tilford Pillar  Anes:  Endotracheal, 0.5% Sensorcaine plain for local  Indications:  Patient is a 51 year old female presented my office with a history of epigastric and right flank pain. Workup and evaluation was consistent for chronic cholecystitis. Patient was noted to have a gallbladder polyp. Risks benefits alternatives of a laparoscopic possible open cholecystectomy were discussed at length the patient including but not limited to risk of bleeding, infection, bile leak, common bile duct injury, small bowel injury, intraoperative cardiac and pulmonary events. Her questions and concerns are addressed the patient was consented for planned procedure.  Procedure note:  Patient is taken to the operating room was placed in supine position on the OR table. At this point the general anesthetic is administered once patient was asleep she is in endotracheally intubated by the nurse anesthetist. At this point her abdomen is prepped with DuraPrep solution and draped in standard fashion. A stab incision was created supraumbilically with 11 blade scalpel. Additional dissection down to subcuticular tissues carried out using a Coker clamp was utilized to grasp the anterior abdominal fascia and lift this anteriorly. A Veress needle is inserted. Saline drop test is utilized firm intraperitoneal placement the pneumoperitoneum was initiated. Once sufficient pneumoperitoneum was obtained an 11 mm insert over a laparoscope allowing visualization the trocar entering into the peritoneal cavity. At this point the inner cannula was removed and the laparoscope was reinserted. There is no evidence of any trocar or Veress needle placement injury. At this time the remaining trochars replaced with a 5 mm can  epigastrium, a 5 mm in the midline, and a 5 mm in the right lateral abdominal wall. Patient's placed into a reverse Trendelenburg left lateral decubitus position. The gallbladder fundus is identified and grasped with a regular grasper and lifted up and over the right lobe the liver. Some omental adhesions were bluntly stripped off the gallbladder body wall with blunt [place] dissection. The peritoneal reflection onto the infundibulum was identified and bluntly stripped again using a [place] exposing both the cystic duct and cystic arteries and her into the infundibulum. Fornical is placed proximally one distally and the cystic duct which was divided between 2 most distal clips. Similarly the cystic artery was ligated with 2 endoclips proximally one distally and the cystic artery was divided between 2 most distal clips. Electrocautery was then utilized dissect the gallbladder free from the gallbladder fossa. Once, or street is placed into the Endo Catch bag and large do this the 10 mm scope is exchanged for a 5 mm scope. The gallbladder is placed into the right lower quadrant. During the dissection there was a small cholecystotomy created with some bile spillage. This is irrigated and aspirated until the returning aspirate was clear. Inspection the gallbladder fossa indicate excellent hemostasis. Endoclips were inspected there is no evidence of any bleeding or bile leak. At this time attention was turned to closure.  Using Endo Close suture device a 2 Vicryl sutures passed to the umbilical trocar site. With this suture and placed a piece of Surgicel snow was placed into the gallbladder fossa. At this time the attention was turned to closure. The gallbladder was retrieved was removed and the local trocar site and intact Endo Catch bag. Some blunt dilatation was required to adequately enlarged the  umbilical trocar site enough to remove the gallbladder. At this point the gallbladder is placed in the back  table and sent as per specimen to pathology. The pneumoperitoneum was evacuated. Trochars were removed. The Vicryl sutures secured. The local anesthetic is instilled. A 4-0 Monocryl utilized reapproximate skin edges at all 4 trocar sites. The skin was washed dried moist dry towel. Benzoin is applied around incision. Half-inch Steri-Strips are placed. The drapes removed the patient was allowed to come out of general static and the patient was transferred to the postanesthetic care unit in stable condition. At the conclusion of the procedure all instrument, sponge, needle counts are correct. Patient tolerated procedure extremely well.  Complications:  None  EBL:  Less than 100 ml  Specimen:  Gallbladder

## 2012-01-18 NOTE — Transfer of Care (Signed)
Immediate Anesthesia Transfer of Care Note  Patient: Denise Hill  Procedure(s) Performed: Procedure(s) (LRB): LAPAROSCOPIC CHOLECYSTECTOMY (N/A)  Patient Location: PACU  Anesthesia Type: General  Level of Consciousness: awake, alert , oriented and patient cooperative  Airway & Oxygen Therapy: Patient Spontanous Breathing and Patient connected to nasal cannula oxygen  Post-op Assessment: Report given to PACU RN and Post -op Vital signs reviewed and stable  Post vital signs: Reviewed and stable  Complications: No apparent anesthesia complications

## 2012-01-18 NOTE — Anesthesia Procedure Notes (Signed)
Procedure Name: Intubation Date/Time: 01/18/2012 12:19 PM Performed by: Carolyne Littles, Eesa Justiss L Pre-anesthesia Checklist: Patient identified, Patient being monitored, Timeout performed, Emergency Drugs available and Suction available Patient Re-evaluated:Patient Re-evaluated prior to inductionOxygen Delivery Method: Circle System Utilized Preoxygenation: Pre-oxygenation with 100% oxygen Intubation Type: IV induction, Rapid sequence and Cricoid Pressure applied Laryngoscope Size: 3 and Miller Grade View: Grade I Tube type: Oral Tube size: 7.0 mm Number of attempts: 1 Airway Equipment and Method: stylet Placement Confirmation: ETT inserted through vocal cords under direct vision,  positive ETCO2 and breath sounds checked- equal and bilateral Secured at: 21 cm Tube secured with: Tape Dental Injury: Teeth and Oropharynx as per pre-operative assessment

## 2012-01-18 NOTE — H&P (Signed)
  NTS SOAP Note  Vital Signs:  Vitals as of: 01/12/2012: Systolic 161: Diastolic 108: Heart Rate 88: Temp 97.28F: Height 54ft 7.5in: Weight 211Lbs 0 Ounces: OFC Not Entered: Respiratory Rate Not Entered: O2 Saturation Not Entered: Pain Level 6: BMI 33  BMI : 32.56 kg/m2  Subjective: This 29 Years 32 Months old Female presents forof RUQ pain.Patient states she's had issues with right upper quadrant abdominal tenderness over the last 2 months. Pain comes on every couple days. She denies any exacerbating factors. She does state pain increases with some movements. She is unsure she's noted any changes with fatty greasy foods although does state she occasionally has loose stools with fatty greasy foods. Denies any change with bowel movements with no melena or hematochezia. No issues of constipation. No family history of biliary disease. No history of jaundice.  Review of Symptoms:  Constitutional:unremarkable Headaches :Blurred vision Nose/Mouth/Throat:unremarkable Cardiovascular:unremarkable Respiratory:unremarkable As per history of present illness Genitourinary:unremarkable Arthralgias of the joints and back Dry, frequent boils Breast:unremarkable Hematolgic/Lymphatic:unremarkable Easy fatigue   Past Medical History:Obtained   Past Medical History  Pregnancy Gravida: 7 Pregnancy Para: 7 Surgical History: cerebral aneurysm clipping, tubal ligation Medical Problems: hypertension Psychiatric History: anxiety depression Allergies: no known drug allergy Medications: lisinopril, hydrochlorothiazide, Wellbutrin   Social History:Obtained  Social History  No alcohol No recreational drug abuse   Smoking Status: Current every day smoker reviewed on 01/18/2012 Started Date: 09/07/1978 Packs per day: 1.00   Family History:Obtained   Family History  diabetes mellitus otherwise noncontributory    Objective  Information: General:Well appearing, well nourished in no distress.Obese Skin:no rash or prominent lesions Head:Atraumatic; no masses; no abnormalities Eyes:conjunctiva clear, EOM intact, PERRL Mouth:Mucous membranes moist, no mucosal lesions. Neck:Supple without lymphadenopathy.  Heart:RRR, no murmur Lungs:CTA bilaterally, no wheezes, rhonchi, rales.  Breathing unlabored. Abdomen:Soft, NT/ND, no HSM, no masses. Extremities:No deformities, clubbing, cyanosis, or edema.    Right upper quadrant ultrasound: 6 millimeter gallbladder polyp noted. No gallbladder wall thickening. No other abnormalities.   Assessment:  Diagnosis &amp; Procedure: DiagnosisCode: 575.11, ProcedureCode: 16109,    Plan: Acute on chronic cholecystitis, and gallbladder polyp. Indications for surgery were discussed at length the patient. surgical options were discussed. Patient will plan to proceed at her convenience with a plan laparoscopic possible open cholecystectomy.  Patient Education:Alternative treatments to surgery were discussed with patient (and family).Risks and benefits  of procedure were fully explained to the patient (and family) who gave informed consent. Patient/family questions were addressed.  Follow-up:Pending Surgery                                     Active Diagnosis and Procedures: 575.11 Chronic cholecystitis   99203 - OFFICE OUTPATIENT NEW 30 MINUTES

## 2012-01-18 NOTE — Interval H&P Note (Signed)
History and Physical Interval Note:  01/18/2012 11:11 AM  Denise Hill  has presented today for surgery, with the diagnosis of Calculus of gallbladder with other cholecystitis, without mention of obstruction   The various methods of treatment have been discussed with the patient and family. After consideration of risks, benefits and other options for treatment, the patient has consented to  Procedure(s) (LRB): LAPAROSCOPIC CHOLECYSTECTOMY (N/A) as a surgical intervention .  The patients' history has been reviewed, patient examined, no change in status, stable for surgery.  I have reviewed the patients' chart and labs.  Questions were answered to the patient's satisfaction.     [name]

## 2012-01-18 NOTE — Anesthesia Preprocedure Evaluation (Signed)
Anesthesia Evaluation  Patient identified by MRN, date of birth, ID band Patient awake    Reviewed: Allergy & Precautions, H&P , NPO status , Patient's Chart, lab work & pertinent test results  History of Anesthesia Complications Negative for: history of anesthetic complications  Airway Mallampati: II      Dental  (+) Teeth Intact   Pulmonary Current Smoker,  breath sounds clear to auscultation        Cardiovascular hypertension, Pt. on medications Rhythm:Regular Rate:Normal     Neuro/Psych  Headaches (cerebral aneurysm clipped 2002.),    GI/Hepatic GERD-  Medicated and Controlled,  Endo/Other    Renal/GU      Musculoskeletal   Abdominal   Peds  Hematology   Anesthesia Other Findings   Reproductive/Obstetrics                           Anesthesia Physical Anesthesia Plan  ASA: II  Anesthesia Plan: General   Post-op Pain Management:    Induction: Intravenous, Rapid sequence and Cricoid pressure planned  Airway Management Planned: Oral ETT  Additional Equipment:   Intra-op Plan:   Post-operative Plan: Extubation in OR  Informed Consent: I have reviewed the patients History and Physical, chart, labs and discussed the procedure including the risks, benefits and alternatives for the proposed anesthesia with the patient or authorized representative who has indicated his/her understanding and acceptance.     Plan Discussed with:   Anesthesia Plan Comments:         Anesthesia Quick Evaluation

## 2012-01-18 NOTE — Anesthesia Postprocedure Evaluation (Signed)
  Anesthesia Post-op Note  Patient: Denise Hill  Procedure(s) Performed: Procedure(s) (LRB): LAPAROSCOPIC CHOLECYSTECTOMY (N/A)  Patient Location: PACU  Anesthesia Type: General  Level of Consciousness: awake, alert , oriented and patient cooperative  Airway and Oxygen Therapy: Patient Spontanous Breathing and Patient connected to nasal cannula oxygen  Post-op Pain: mild  Post-op Assessment: Post-op Vital signs reviewed, Patient's Cardiovascular Status Stable, Respiratory Function Stable and Patent Airway  Post-op Vital Signs: Reviewed and stable  Complications: No apparent anesthesia complications

## 2012-01-18 NOTE — Discharge Instructions (Signed)
PATIENT INSTRUCTIONS POST-ANESTHESIA  IMMEDIATELY FOLLOWING SURGERY:  Do not drive or operate machinery for the first twenty four hours after surgery.  Do not make any important decisions for twenty four hours after surgery or while taking narcotic pain medications or sedatives.  If you develop intractable nausea and vomiting or a severe headache please notify your doctor immediately.  FOLLOW-UP:  Please make an appointment with your surgeon as instructed. You do not need to follow up with anesthesia unless specifically instructed to do so.  WOUND CARE INSTRUCTIONS (if applicable):  Keep a dry clean dressing on the anesthesia/puncture wound site if there is drainage.  Once the wound has quit draining you may leave it open to air.  Generally you should leave the bandage intact for twenty four hours unless there is drainage.  If the epidural site drains for more than 36-48 hours please call the anesthesia department.  QUESTIONS?:  Please feel free to call your physician or the hospital operator if you have any questions, and they will be happy to assist you.     [facility] Anesthesia Department [address] [place] [phone]    Laparoscopic Cholecystectomy Laparoscopic cholecystectomy is surgery to remove the gallbladder. The gallbladder is located slightly to the right of center in the abdomen, behind the liver. It is a concentrating and storage sac for the bile produced in the liver. Bile aids in the digestion and absorption of fats. Gallbladder disease (cholecystitis) is an inflammation of your gallbladder. This condition is usually caused by a buildup of gallstones (cholelithiasis) in your gallbladder. Gallstones can block the flow of bile, resulting in inflammation and pain. In severe cases, emergency surgery may be required. When emergency surgery is not required, you will have time to prepare for the procedure. Laparoscopic surgery is an alternative to open surgery.  Laparoscopic surgery usually has a shorter recovery time. Your common bile duct may also need to be examined and explored. Your caregiver will discuss this with you if he or she feels this should be done. If stones are found in the common bile duct, they may be removed. LET YOUR CAREGIVER KNOW ABOUT:  Allergies to food or medicine.   Medicines taken, including vitamins, herbs, eyedrops, over-the-counter medicines, and creams.   Use of steroids (by mouth or creams).   Previous problems with anesthetics or numbing medicines.   History of bleeding problems or blood clots.   Previous surgery.   Other health problems, including diabetes and kidney problems.   Possibility of pregnancy, if this applies.  RISKS AND COMPLICATIONS All surgery is associated with risks. Some problems that may occur following this procedure include:  Infection.   Damage to the common bile duct, nerves, arteries, veins, or other internal organs such as the stomach or intestines.   Bleeding.   A stone may remain in the common bile duct.  BEFORE THE PROCEDURE  Do not take aspirin for 3 days prior to surgery or blood thinners for 1 week prior to surgery.   Do not eat or drink anything after midnight the night before surgery.   Let your caregiver know if you develop a cold or other infectious problem prior to surgery.   You should be present 60 minutes before the procedure or as directed.  PROCEDURE  You will be given medicine that makes you sleep (general anesthetic). When you are asleep, your surgeon will make several small cuts (incisions) in your abdomen. One of these incisions is used to insert a  small, lighted scope (laparoscope) into the abdomen. The laparoscope helps the surgeon see into your abdomen. Carbon dioxide gas will be pumped into your abdomen. The gas allows more room for the surgeon to perform your surgery. Other operating instruments are inserted through the other incisions. Laparoscopic  procedures may not be appropriate when:  There is major scarring from previous surgery.   The gallbladder is extremely inflamed.   There are bleeding disorders or unexpected cirrhosis of the liver.   A pregnancy is near term.   Other conditions make the laparoscopic procedure impossible.  If your surgeon feels it is not safe to continue with a laparoscopic procedure, he or she will perform an open abdominal procedure. In this case, the surgeon will make an incision to open the abdomen. This gives the surgeon a larger view and field to work within. This may allow the surgeon to perform procedures that sometimes cannot be performed with a laparoscope alone. Open surgery has a longer recovery time. AFTER THE PROCEDURE  You will be taken to the recovery area where a nurse will watch and check your progress.   You may be allowed to go home the same day.   Do not resume physical activities until directed by your caregiver.   You may resume a normal diet and activities as directed.  Document Released: 08/24/2005 Document Revised: 08/13/2011 Document Reviewed: 02/06/2011 [facility] Patient Information 2012 [place], [place].

## 2012-01-18 NOTE — Preoperative (Signed)
Beta Blockers   Reason not to administer Beta Blockers:Not Applicable 

## 2012-01-20 ENCOUNTER — Encounter (HOSPITAL_COMMUNITY): Payer: Self-pay | Admitting: General Surgery

## 2012-01-28 ENCOUNTER — Other Ambulatory Visit: Payer: Self-pay | Admitting: Family Medicine

## 2012-01-28 DIAGNOSIS — Z139 Encounter for screening, unspecified: Secondary | ICD-10-CM

## 2012-02-08 ENCOUNTER — Ambulatory Visit (HOSPITAL_COMMUNITY)
Admission: RE | Admit: 2012-02-08 | Discharge: 2012-02-08 | Disposition: A | Discharge status: Home / Self Care | Payer: BC Managed Care – PPO | Source: Ambulatory Visit | Attending: Family Medicine | Admitting: Family Medicine

## 2012-02-08 DIAGNOSIS — Z1231 Encounter for screening mammogram for malignant neoplasm of breast: Secondary | ICD-10-CM | POA: Insufficient documentation

## 2012-02-08 DIAGNOSIS — Z139 Encounter for screening, unspecified: Secondary | ICD-10-CM

## 2012-02-22 ENCOUNTER — Emergency Department (HOSPITAL_COMMUNITY)
Admission: EM | Admit: 2012-02-22 | Discharge: 2012-02-22 | Disposition: A | Discharge status: Home / Self Care | Payer: BC Managed Care – PPO | Attending: Emergency Medicine | Admitting: Emergency Medicine

## 2012-02-22 ENCOUNTER — Emergency Department (HOSPITAL_COMMUNITY): Payer: BC Managed Care – PPO

## 2012-02-22 ENCOUNTER — Encounter (HOSPITAL_COMMUNITY): Payer: Self-pay | Admitting: *Deleted

## 2012-02-22 DIAGNOSIS — G8918 Other acute postprocedural pain: Secondary | ICD-10-CM | POA: Insufficient documentation

## 2012-02-22 DIAGNOSIS — I10 Essential (primary) hypertension: Secondary | ICD-10-CM | POA: Insufficient documentation

## 2012-02-22 DIAGNOSIS — R51 Headache: Secondary | ICD-10-CM

## 2012-02-22 DIAGNOSIS — R109 Unspecified abdominal pain: Secondary | ICD-10-CM

## 2012-02-22 DIAGNOSIS — A5909 Other urogenital trichomoniasis: Secondary | ICD-10-CM | POA: Insufficient documentation

## 2012-02-22 DIAGNOSIS — A5903 Trichomonal cystitis and urethritis: Secondary | ICD-10-CM

## 2012-02-22 HISTORY — DX: Headache: R51

## 2012-02-22 HISTORY — DX: Headache, unspecified: R51.9

## 2012-02-22 HISTORY — DX: Other chronic pain: G89.29

## 2012-02-22 LAB — DIFFERENTIAL
Basophils Absolute: 0 10*3/uL (ref 0.0–0.1)
Basophils Relative: 0 % (ref 0–1)
Eosinophils Absolute: 0.3 10*3/uL (ref 0.0–0.7)
Eosinophils Relative: 4 % (ref 0–5)
Lymphocytes Relative: 57 % — ABNORMAL HIGH (ref 12–46)
Lymphs Abs: 3.9 10*3/uL (ref 0.7–4.0)
Monocytes Absolute: 0.4 10*3/uL (ref 0.1–1.0)
Monocytes Relative: 6 % (ref 3–12)
Neutro Abs: 2.3 10*3/uL (ref 1.7–7.7)
Neutrophils Relative %: 33 % — ABNORMAL LOW (ref 43–77)

## 2012-02-22 LAB — URINALYSIS, ROUTINE W REFLEX MICROSCOPIC
Bilirubin Urine: NEGATIVE
Glucose, UA: NEGATIVE mg/dL
Ketones, ur: NEGATIVE mg/dL
Nitrite: NEGATIVE
Protein, ur: NEGATIVE mg/dL
Specific Gravity, Urine: >1.03 — ABNORMAL HIGH (ref 1.005–1.030)
Urobilinogen, UA: 0.2 mg/dL (ref 0.0–1.0)
pH: 6 (ref 5.0–8.0)

## 2012-02-22 LAB — URINE MICROSCOPIC-ADD ON

## 2012-02-22 LAB — COMPREHENSIVE METABOLIC PANEL
ALT: 19 U/L (ref 0–35)
AST: 21 U/L (ref 0–37)
Albumin: 3.6 g/dL (ref 3.5–5.2)
Alkaline Phosphatase: 70 U/L (ref 39–117)
BUN: 7 mg/dL (ref 6–23)
CO2: 25 mEq/L (ref 19–32)
Calcium: 9.9 mg/dL (ref 8.4–10.5)
Chloride: 105 mEq/L (ref 96–112)
Creatinine, Ser: 0.64 mg/dL (ref 0.50–1.10)
GFR calc Af Amer: >90 mL/min (ref >90)
GFR calc non Af Amer: >90 mL/min (ref >90)
Glucose, Bld: 89 mg/dL (ref 70–99)
Potassium: 4 mEq/L (ref 3.5–5.1)
Sodium: 140 mEq/L (ref 135–145)
Total Bilirubin: 0.2 mg/dL — ABNORMAL LOW (ref 0.3–1.2)
Total Protein: 7.6 g/dL (ref 6.0–8.3)

## 2012-02-22 LAB — PREGNANCY, URINE: Preg Test, Ur: NEGATIVE

## 2012-02-22 LAB — CBC
HCT: 35.9 % — ABNORMAL LOW (ref 36.0–46.0)
Hemoglobin: 11.8 g/dL — ABNORMAL LOW (ref 12.0–15.0)
MCH: 26.5 pg (ref 26.0–34.0)
MCHC: 32.9 g/dL (ref 30.0–36.0)
MCV: 80.7 fL (ref 78.0–100.0)
Platelets: 236 10*3/uL (ref 150–400)
RBC: 4.45 MIL/uL (ref 3.87–5.11)
RDW: 14.6 % (ref 11.5–15.5)
WBC: 6.8 10*3/uL (ref 4.0–10.5)

## 2012-02-22 LAB — LIPASE, BLOOD: Lipase: 57 U/L (ref 11–59)

## 2012-02-22 MED ORDER — MORPHINE SULFATE 4 MG/ML IJ SOLN
4.0000 mg | INTRAMUSCULAR | Status: DC | PRN
  Administered 2012-02-22: 4 mg via INTRAVENOUS
  Filled 2012-02-22: qty 1

## 2012-02-22 MED ORDER — ONDANSETRON HCL 4 MG/2ML IJ SOLN
4.0000 mg | INTRAMUSCULAR | Status: DC | PRN
  Administered 2012-02-22: 4 mg via INTRAVENOUS
  Filled 2012-02-22: qty 2

## 2012-02-22 MED ORDER — PROMETHAZINE HCL 25 MG PO TABS: 25.0000 mg | ORAL_TABLET | Freq: Four times a day (QID) | ORAL | Status: AC | PRN

## 2012-02-22 MED ORDER — METRONIDAZOLE 500 MG PO TABS
2000.0000 mg | ORAL_TABLET | Freq: Once | ORAL | Status: AC
  Administered 2012-02-22: 2000 mg via ORAL
  Filled 2012-02-22: qty 1

## 2012-02-22 MED ORDER — OXYCODONE-ACETAMINOPHEN 5-325 MG PO TABS: ORAL_TABLET | ORAL | Status: AC

## 2012-02-22 MED ORDER — SODIUM CHLORIDE 0.9 % IV SOLN
INTRAVENOUS | Status: DC
  Administered 2012-02-22: 13:00:00 via INTRAVENOUS

## 2012-02-22 NOTE — ED Notes (Signed)
abd pain, headache, n/v.  No BM for 2-3 days.  Cholecystectomy on 5/13

## 2012-02-22 NOTE — ED Provider Notes (Signed)
History  This chart was scribed for Laray Anger, DO by Bennett Scrape. This patient was seen in room APA15/[facility] and the patient's care was started at 12:23PM.  CSN: [id number]  Arrival date & time 02/22/12  1152   First MD Initiated Contact with Patient 02/22/12 1223      Chief Complaint  Patient presents with  . Abdominal Pain    The history is provided by the patient. No language interpreter was used.    Denise Hill is a 51 y.o. female who presents to the Emergency Department complaining of gradual onset and persistence of multiple intermittent episodes of abdominal "pain" for the past week, worse over the past several days.  Has been associated with several episodes of N/V.  Pain worsens with movement, improves with res.  Pt is s/p cholecystectomy by Dr. Leticia Penna on 01/18/12, and endorses that she told him about the pain at her f/u appointment.  States she was told that she should expect to have intermittent pains as it heals. Denies diarrhea, no black or blood in stools or emesis, no back pain, no CP/SOB, no cough, no dysuria, no vaginal bleeding/discharge.  She also c/o gradual onset and persistence of constant acute flair of her chronic migraine headache for the past several days.  Describes the headache as per her usual chronic migraine headache pain pattern for the past several years.  Denies headache was sudden or maximal in onset or at any time.  Denies visual changes, no focal motor weakness, no tingling/numbness in extremities, no fevers, no neck pain, no rash.      Past Medical History  Diagnosis Date  . Hypertension   . Aneurysm 2002    coil / clip  [facility]  . Chronic headaches     Past Surgical History  Procedure Date  . Cerebral aneurysm repair [facility]  . Salpingoophorectomy 1980    Right side removal  . Tubal ligation 1990;1992  . Dilation and curettage of uterus   . Appendectomy 1980  . Cervix surgery 1990    Freezing of  cervical cells  . Cholecystectomy 01/18/2012    Procedure: LAPAROSCOPIC CHOLECYSTECTOMY;  Surgeon: Fabio Bering, MD;  Location: [facility];  Service: General;  Laterality: N/A;    History  Substance Use Topics  . Smoking status: Current Everyday Smoker -- 0.5 packs/day for 35 years    Types: Cigarettes  . Smokeless tobacco: Not on file  . Alcohol Use: No    Review of Systems ROS: Statement: All systems negative except as marked or noted in the HPI; Constitutional: Negative for fever and chills. ; ; Eyes: Negative for eye pain, redness and discharge. ; ; ENMT: Negative for ear pain, hoarseness, nasal congestion, sinus pressure and sore throat. ; ; Cardiovascular: Negative for chest pain, palpitations, diaphoresis, dyspnea and peripheral edema. ; ; Respiratory: Negative for cough, wheezing and stridor. ; ; Gastrointestinal: +abd pain, N/V. Negative for diarrhea, blood in stool, hematemesis, jaundice and rectal bleeding. . ; ; Genitourinary: Negative for dysuria, flank pain and hematuria. ; ; Musculoskeletal: Negative for back pain and neck pain. Negative for swelling and trauma.; ; Skin: Negative for pruritus, rash, abrasions, blisters, bruising and skin lesion.; ; Neuro: +migraine headache.  Negative for lightheadedness and neck stiffness. Negative for weakness, altered level of consciousness , altered mental status, extremity weakness, paresthesias, involuntary movement, seizure and syncope.     Allergies  Review of patient's allergies indicates no known allergies.  Home  Medications   Current Outpatient Rx  Name Route Sig Dispense Refill  . BIOFLEX PO Oral Take 1 tablet by mouth daily.    . BUPROPION HCL ER (SR) 150 MG PO TB12 Oral Take 150 mg by mouth 2 (two) times daily.    Marland Kitchen CRANBERRY PO Oral Take 1 tablet by mouth daily.    Marland Kitchen HYDROCODONE-ACETAMINOPHEN 5-325 MG PO TABS Oral Take 1 tablet by mouth every 6 (six) hours as needed. Pain    . LISINOPRIL-HYDROCHLOROTHIAZIDE 20-12.5 MG PO TABS  Oral Take 1 tablet by mouth daily.    Marland Kitchen RANITIDINE HCL 150 MG PO CAPS Oral Take 150 mg by mouth 2 (two) times daily as needed.      Triage Vitals: BP 154/96  Pulse 85  Temp 98 F (36.7 C) (Oral)  Resp 20  Ht 5' 7.5" (1.715 m)  Wt 207 lb (93.895 kg)  BMI 31.94 kg/m2  SpO2 100%  LMP 01/15/2012  Physical Exam 1240: Physical examination:  Nursing notes reviewed; Vital signs and O2 SAT reviewed;  Constitutional: Well developed, Well nourished, Well hydrated, In no acute distress; Head:  Normocephalic, atraumatic; Eyes: EOMI, PERRL, No scleral icterus; ENMT: Mouth and pharynx normal, Mucous membranes moist; Neck: Supple, Full range of motion, No lymphadenopathy; Cardiovascular: Regular rate and rhythm, No murmur, rub, or gallop; Respiratory: Breath sounds clear & equal bilaterally, No rales, rhonchi, wheezes.  Speaking full sentences with ease, Normal respiratory effort/excursion; Chest: Nontender, Movement normal; Abdomen: Soft, +mild RUQ and mid-epigastric tenderness to palp, no rebound or guarding, Nondistended, Normal bowel sounds; Genitourinary: No CVA tenderness; Extremities: Pulses normal, No tenderness, No edema, No calf edema or asymmetry.; Neuro: AA&Ox3, Major CN grossly intact.  Speech clear. No facial droop, normal coordination. No gross focal motor or sensory deficits in extremities.; Skin: Color normal, Warm, Dry.    ED Course  Procedures   MDM  MDM Reviewed: nursing note, vitals and previous chart Interpretation: labs, x-ray and ultrasound   Results for orders placed during the hospital encounter of 02/22/12  PREGNANCY, URINE      Component Value Range   Preg Test, Ur NEGATIVE  NEGATIVE  URINALYSIS, ROUTINE W REFLEX MICROSCOPIC      Component Value Range   Color, Urine YELLOW  YELLOW   APPearance CLEAR  CLEAR   Specific Gravity, Urine >1.030 (*) 1.005 - 1.030   pH 6.0  5.0 - 8.0   Glucose, UA NEGATIVE  NEGATIVE mg/dL   Hgb urine dipstick TRACE (*) NEGATIVE    Bilirubin Urine NEGATIVE  NEGATIVE   Ketones, ur NEGATIVE  NEGATIVE mg/dL   Protein, ur NEGATIVE  NEGATIVE mg/dL   Urobilinogen, UA 0.2  0.0 - 1.0 mg/dL   Nitrite NEGATIVE  NEGATIVE   Leukocytes, UA TRACE (*) NEGATIVE  COMPREHENSIVE METABOLIC PANEL      Component Value Range   Sodium 140  135 - 145 mEq/L   Potassium 4.0  3.5 - 5.1 mEq/L   Chloride 105  96 - 112 mEq/L   CO2 25  19 - 32 mEq/L   Glucose, Bld 89  70 - 99 mg/dL   BUN 7  6 - 23 mg/dL   Creatinine, Ser 1.61  0.50 - 1.10 mg/dL   Calcium 9.9  8.4 - 09.6 mg/dL   Total Protein 7.6  6.0 - 8.3 g/dL   Albumin 3.6  3.5 - 5.2 g/dL   AST 21  0 - 37 U/L   ALT 19  0 - 35 U/L  Alkaline Phosphatase 70  39 - 117 U/L   Total Bilirubin 0.2 (*) 0.3 - 1.2 mg/dL   GFR calc non Af Amer >90  >90 mL/min   GFR calc Af Amer >90  >90 mL/min  CBC      Component Value Range   WBC 6.8  4.0 - 10.5 K/uL   RBC 4.45  3.87 - 5.11 MIL/uL   Hemoglobin 11.8 (*) 12.0 - 15.0 g/dL   HCT 16.1 (*) 09.6 - 04.5 %   MCV 80.7  78.0 - 100.0 fL   MCH 26.5  26.0 - 34.0 pg   MCHC 32.9  30.0 - 36.0 g/dL   RDW 40.9  81.1 - 91.4 %   Platelets 236  150 - 400 K/uL  DIFFERENTIAL      Component Value Range   Neutrophils Relative 33 (*) 43 - 77 %   Neutro Abs 2.3  1.7 - 7.7 K/uL   Lymphocytes Relative 57 (*) 12 - 46 %   Lymphs Abs 3.9  0.7 - 4.0 K/uL   Monocytes Relative 6  3 - 12 %   Monocytes Absolute 0.4  0.1 - 1.0 K/uL   Eosinophils Relative 4  0 - 5 %   Eosinophils Absolute 0.3  0.0 - 0.7 K/uL   Basophils Relative 0  0 - 1 %   Basophils Absolute 0.0  0.0 - 0.1 K/uL  LIPASE, BLOOD      Component Value Range   Lipase 57  11 - 59 U/L  URINE MICROSCOPIC-ADD ON      Component Value Range   Squamous Epithelial / LPF FEW (*) RARE   WBC, UA 0-2  <3 WBC/hpf   RBC / HPF 0-2  <3 RBC/hpf   Bacteria, UA FEW (*) RARE   Urine-Other TRICHOMONAS PRESENT      US Abdomen Complete 02/22/2012  *RADIOLOGY REPORT*  Clinical Data:  Upper abdominal pain, prior  cholecystectomy  ULTRASOUND ABDOMEN:  Technique:  Sonography of upper abdominal structures was performed.  Comparison:  07/21/2011  Gallbladder:  Surgically absent  Common bile duct:  5 mm diameter, normal  Liver:  Echogenic, likely fatty infiltration, though this can be seen with cirrhosis and certain infiltrative disorders.  No definite focal mass or nodularity. Hepatopetal portal venous flow.  IVC:  Normal appearance  Pancreas:  Small portion of distal tail not visualized; remaining pancreas normal in appearance.  Spleen:  Normal morphology, 7.2 cm length.  Right kidney:  12.1 cm length. Normal morphology without mass or hydronephrosis.  Left kidney:  10.0 cm length. Normal morphology without mass or hydronephrosis.  Aorta:  Obscured distally by bowel gas.  Visualized portions normal caliber.  Other:  No free fluid  IMPRESSION: Probable mild fatty infiltration of liver as above. Prior cholecystectomy. Otherwise negative exam.  Original Report Authenticated By: Lollie Marrow, M.D.    Dg Abd Acute W/chest 02/22/2012  *RADIOLOGY REPORT*  Clinical Data:  abdominal pain, nausea  ACUTE ABDOMEN SERIES (ABDOMEN 2 VIEW & CHEST 1 VIEW)  Comparison: 07/21/2011  Findings: Cardiomediastinal silhouette is unremarkable.  No acute infiltrate or pulmonary edema.  There is nonspecific nonobstructive bowel gas pattern.  No free abdominal air.  Post cholecystectomy surgical clips are noted. Pelvic phleboliths are noted.  IMPRESSION: No acute disease.  Nonspecific nonobstructive bowel gas pattern. Post cholecystectomy surgical clips are noted.  No free abdominal air.  Original Report Authenticated By: Natasha Mead, M.D.    Results for ANAIZA, BEHRENS (MRN [id number]) as of  02/22/2012 15:14  Ref. Range 07/21/2011 14:59 12/05/2011 13:50 01/13/2012 15:00 02/22/2012 12:55  Hemoglobin Latest Range: 12.0-15.0 g/dL 29.5 (L) 62.1 30.8 (L) 11.8 (L)  HCT Latest Range: 36.0-46.0 % 36.0 37.5 37.0 35.9 (L)      3:24 PM:  Pt states she feels  "better now" and wants to go home.  No N/V or stooling while in the ED, has tol PO well.  VS remain stable, resps easy, appears comfortable.  Will treat for trich in urine.  Doubt abscess related to recent chole, as [place] is negative, no fevers, WBC normal.  Doubt headache related to cerebral aneurysm, as has been present for the past several days, neuro exam intact, headache was not sudden or maximal, and pt describes the headache as per her usual migraine headache pain pattern.  Strict return precautions given.  Dx testing d/w pt.  Questions answered.  Verb understanding, agreeable to d/c home with outpt f/u.        I personally performed the services described in this documentation, which was scribed in my presence. The recorded information has been reviewed and considered. Yussuf Sawyers Allison Quarry, DO 02/23/12 1306

## 2012-02-22 NOTE — Discharge Instructions (Signed)
RESOURCE GUIDE  Chronic Pain Problems: Contact [facility]  [phone] Patients need to be referred by their primary care doctor.  Insufficient Money for Medicine: Contact United Way:  call "211" or [facility] [phone].  No Primary Care Doctor: - Call [facility]  [phone] - can help you locate a primary care doctor that  accepts your insurance, provides certain services, etc. - Physician Referral Service- 1-800-533-3463  Agencies that provide inexpensive medical care: - [facility]  [phone] - [facility]  [phone] - [facility]  [phone] - [facility]  [phone] - Planned Parenthood  [phone] - [facility]  [phone]  Medicaid-accepting [place] Providers: - [facility]- [address]  [phone], Mon-Fri 9am-7pm, Sat 9am-1pm - [facility]- [address], [address]  [phone] - [facility]- [address], [address]  [phone] - [facility]- [address] [place] [address]  [phone] - Veita Bland- [address], [phone]  Only accepts [place] Access Medicaid patients after they have their name  applied to their card  Self Pay (no insurance) in [place]: - Sickle Cell Patients: Dr Eric Dean, [facility]  [address], [phone] - [facility]- [address]  [phone]       -     [facility] [place]- [address]       -     [facility]- see information above (Speak to Pam H if you do not have insurance)       -  [facility]- [address], [phone]       -  [facility] [place]- [address],  [phone]       -  [facility]- [address] [place] [address], [phone]       -  Dr Osei-Bonsu-  [address], [place], [phone]       -  [facility]- [address], [phone]       -  [facility]- [address] [place] [address], [phone], also [address], [phone]       -    [facility]- [address], [phone], 1st & 3rd Saturday   every month, 10am-1pm  1) Find a Doctor and Pay Out of Pocket Although you won't have to find out who is covered by your insurance plan, it is a good idea to ask around and get recommendations. You will then need to call the office and see if the doctor you have chosen will accept you as a new patient and what types of options they offer for patients who are self-pay. Some doctors offer discounts or will set up payment plans for their patients who do not have insurance, but you will need to ask so you aren't surprised when you get to your appointment.  2) Contact Your Local Health Department Not all health departments have doctors that can see patients for sick visits, but many do, so it is worth a call to see if yours does. If you don't know where your local health department is, you can check in your phone book. The CDC also has a tool to help you locate your state's health department, and many state websites also have   listings of all of their local health departments.  3) Find a Walk-in Clinic If your illness is not likely to be very severe or complicated, you may want to try a walk in clinic. These are popping up all over the country in pharmacies, drugstores, and shopping centers. They're usually staffed by nurse practitioners or physician assistants that have been trained to treat common illnesses and complaints. They're usually fairly quick and inexpensive. However, if you have serious medical issues or chronic medical problems, these are probably not your best option  STD Testing - [place] [facility] [place], [facility] Clinic, [address], [place], phone [phone] or 1-877-539-9860.  Monday - Friday, call for an appointment. - [place]  [facility] [place], [facility] Clinic, [address], [place], phone [phone] or 1-877-539-9860.  Monday - Friday, call for an appointment.  Abuse/Neglect: - [place] Child Abuse Hotline (336) 641-3795 - [place] Child Abuse Hotline 800-378-5315 (After Hours)  Emergency Shelter:  [facility] (336) 271-5985  Maternity Homes: - [facility] (336) 275-9566 - [facility] (704) 372-4663  MRSA Hotline #:   832-7006  [place] Resources  [facility] [place]  [employer] [place] [facility] [address]                 [address]         [address]  [place]                                               [place]                              [place] Phone:  [phone]                                  Phone:  [phone]                   Phone:  [phone]  [place] [facility], [phone] - [place] Services - [facility]- 1-888-581-9988       -     [facility] in [place], [address],                                  336-349-4454, Insurance  [place] Child Abuse Hotline (336) 342-1394 or (336) 342-3537 (After Hours)   Behavioral Health Services  Substance Abuse Resources: - [facility]  336-882-2125 - [facility] 336-784-9470 - [facility] 336-285-9073 - [facility] 336-845-3988 - Residential & Outpatient Substance Abuse Program  800-659-3381  Psychological Services: -  Health  [phone] - [facility]  [phone] - [place] [facility], [address], [place], ACCESS LINE: 1-800-853-5163 or 336-641-4981, Http://www.[facility].com/services/adult.htm  Dental Assistance  If unable to pay or uninsured, contact:  [facility] or [place] [facility] to become qualified for the adult dental  clinic.  Patients with Medicaid:  [facility] [address], [phone], [phone]  If unable   to pay, or uninsured, contact [facility] (714)515-4166) or [facility] 819-667-6554 in [place], [phone] in [facility]) to become qualified for the adult dental clinic  Other Low-Cost Community Dental Services: - [facility]- [address] [place], [place], [place], [address], [phone], [address]. [address], 2nd and 4th Thursday of the month at 6:30am.  10 clients each day by appointment, can sometimes see walk-in patients if someone does not show for an appointment. [facility]- [address] [place], [place], [address], [phone] - [facility]- [address] [place], [place], [address], [phone] [facility] Health Department- [phone] [facility] Health Department- [phone] [facility] [phone]     Take the prescriptions as directed.  The trichomonas infection was treated while you were in the Emergency Department.  Call your regular medical doctor and your Surgeon today to schedule a follow up appointment this week.  Return to the Emergency Department immediately sooner if worsening.

## 2013-02-20 ENCOUNTER — Emergency Department (HOSPITAL_COMMUNITY)
Admission: EM | Admit: 2013-02-20 | Discharge: 2013-02-20 | Disposition: A | Discharge status: Home / Self Care | Payer: Self-pay | Attending: Emergency Medicine | Admitting: Emergency Medicine

## 2013-02-20 ENCOUNTER — Encounter (HOSPITAL_COMMUNITY): Payer: Self-pay | Admitting: *Deleted

## 2013-02-20 DIAGNOSIS — F17200 Nicotine dependence, unspecified, uncomplicated: Secondary | ICD-10-CM | POA: Insufficient documentation

## 2013-02-20 DIAGNOSIS — Z8669 Personal history of other diseases of the nervous system and sense organs: Secondary | ICD-10-CM | POA: Insufficient documentation

## 2013-02-20 DIAGNOSIS — R519 Headache, unspecified: Secondary | ICD-10-CM

## 2013-02-20 DIAGNOSIS — R51 Headache: Secondary | ICD-10-CM | POA: Insufficient documentation

## 2013-02-20 DIAGNOSIS — K089 Disorder of teeth and supporting structures, unspecified: Secondary | ICD-10-CM | POA: Insufficient documentation

## 2013-02-20 DIAGNOSIS — R599 Enlarged lymph nodes, unspecified: Secondary | ICD-10-CM | POA: Insufficient documentation

## 2013-02-20 DIAGNOSIS — R221 Localized swelling, mass and lump, neck: Secondary | ICD-10-CM | POA: Insufficient documentation

## 2013-02-20 DIAGNOSIS — R52 Pain, unspecified: Secondary | ICD-10-CM | POA: Insufficient documentation

## 2013-02-20 DIAGNOSIS — K029 Dental caries, unspecified: Secondary | ICD-10-CM | POA: Insufficient documentation

## 2013-02-20 DIAGNOSIS — R220 Localized swelling, mass and lump, head: Secondary | ICD-10-CM | POA: Insufficient documentation

## 2013-02-20 DIAGNOSIS — I10 Essential (primary) hypertension: Secondary | ICD-10-CM | POA: Insufficient documentation

## 2013-02-20 DIAGNOSIS — Z79899 Other long term (current) drug therapy: Secondary | ICD-10-CM | POA: Insufficient documentation

## 2013-02-20 MED ORDER — AMOXICILLIN 500 MG PO CAPS: 500.0000 mg | ORAL_CAPSULE | Freq: Three times a day (TID) | ORAL | Status: DC

## 2013-02-20 MED ORDER — IBUPROFEN 600 MG PO TABS: 600.0000 mg | ORAL_TABLET | Freq: Four times a day (QID) | ORAL | Status: DC | PRN

## 2013-02-20 MED ORDER — HYDROCODONE-ACETAMINOPHEN 5-325 MG PO TABS: 1.0000 | ORAL_TABLET | ORAL | Status: DC | PRN

## 2013-02-20 NOTE — Discharge Instructions (Signed)
Dental Caries Dental caries (cavities) are areas of tooth decay. Cavities are usually caused by a combination of poor dental care; sugar; tobacco, alcohol, and drug abuse; decreased saliva production; and receding gums. If cavities are not treated by a dentist, they grow in size. This can cause toothaches, infection, and loss of the tooth. Cavities of the outer tooth enamel do not cause symptoms. Dental pain from cold drinks may be the first sign the enamel has broken down and decay has spread toward the root of the tooth. This can cause the tooth to die or become infected. If a cavity is treated before it causes toothache, the tooth can usually be saved. Cavities can be prevented by good oral hygiene. Brushing your teeth in the morning and before bed, and using dental floss once daily helps remove plaque and reduce bacteria. Candy, soft drinks, and other sources of sugar promote tooth decay by promoting the growth of bacteria in the mouth. Proper diet, fluoride, dental cleaning, and fillings are important in preventing the loss of teeth from decay. Antibiotics, root canal treatment, or dental extraction may be needed if the decay is severe. Take any pain medication or antibiotics as directed by your caregiver. It is important that you follow up with a dentist for definitive care. SEEK MEDICAL CARE IF:   You or your child has an oral temperature above 102 F (38.9 C).  There is difficulty opening the mouth.  There is difficulty swallowing or handling secretions.  There is difficulty breathing.  There is chest pain.  There are worsening or concerning symptoms. Document Released: 10/01/2004 Document Revised: 11/16/2011 Document Reviewed: 12/17/2009 [facility] Patient Information 2014 [place], [place].

## 2013-02-20 NOTE — ED Provider Notes (Signed)
History     CSN: [id number]  Arrival date & time 02/20/13  1617   First MD Initiated Contact with Patient 02/20/13 1805      Chief Complaint  Patient presents with  . Facial Pain    (Consider location/radiation/quality/duration/timing/severity/associated sxs/prior treatment) HPI Denise Hill is a 52 y.o. female who presents to the ED with facial pain. The pain is located on the right side of the face. The pain started yesterday. She has a lot of dental problems and not sure if this is from the teeth or not. The history was provided by the patient.  Past Medical History  Diagnosis Date  . Hypertension   . Aneurysm 2002    coil / clip  [facility]  . Chronic headaches     Past Surgical History  Procedure Laterality Date  . Cerebral aneurysm repair  [facility]  . Salpingoophorectomy  1980    Right side removal  . Tubal ligation  1990;1992  . Dilation and curettage of uterus    . Appendectomy  1980  . Cervix surgery  1990    Freezing of cervical cells  . Cholecystectomy  01/18/2012    Procedure: LAPAROSCOPIC CHOLECYSTECTOMY;  Surgeon: Fabio Bering, MD;  Location: [facility];  Service: General;  Laterality: N/A;    No family history on file.  History  Substance Use Topics  . Smoking status: Current Every Day Smoker -- 0.50 packs/day for 35 years    Types: Cigarettes  . Smokeless tobacco: Not on file  . Alcohol Use: No    OB History   Grav Para Term Preterm Abortions TAB SAB Ect Mult Living                  Review of Systems  Constitutional: Negative for fever and chills.  HENT: Positive for facial swelling and dental problem. Negative for congestion, sore throat and neck pain.   Respiratory: Negative for cough.   Gastrointestinal: Negative for nausea, vomiting and abdominal pain.  Musculoskeletal: Negative for back pain.  Neurological: Negative for headaches.  Psychiatric/Behavioral: The patient is not nervous/anxious.     Allergies    Review of patient's allergies indicates not on file.  Home Medications   Current Outpatient Rx  Name  Route  Sig  Dispense  Refill  . CRANBERRY PO   Oral   Take 1 tablet by mouth daily.         Marland Kitchen lisinopril-hydrochlorothiazide (PRINZIDE,ZESTORETIC) 20-12.5 MG per tablet   Oral   Take 1 tablet by mouth daily.           BP 146/89  Pulse 91  Temp(Src) 99 F (37.2 C) (Oral)  Resp 18  Ht 5' 7.5" (1.715 m)  Wt 217 lb (98.431 kg)  BMI 33.47 kg/m2  SpO2 100%  LMP 01/15/2012  Physical Exam  Nursing note and vitals reviewed. Constitutional: She is oriented to person, place, and time. She appears well-developed and well-nourished. No distress.  HENT:  Head:    Right Ear: Tympanic membrane normal.  Left Ear: Tympanic membrane normal.  Nose: Right sinus exhibits maxillary sinus tenderness.  Mouth/Throat: Uvula is midline, oropharynx is clear and moist and mucous membranes are normal. Dental caries present.  Multiple dental caries. Tender with palpation lower right dental area. Tender with palpation right maxillary sinus area with minimal swelling.   Eyes: Conjunctivae and EOM are normal.  Neck: Normal range of motion. Neck supple.  Cardiovascular: Normal rate and regular  rhythm.   Pulmonary/Chest: Effort normal and breath sounds normal.  Abdominal: Soft. Bowel sounds are normal. There is no tenderness.  Musculoskeletal: Normal range of motion.  Lymphadenopathy:    She has cervical adenopathy.  Neurological: She is alert and oriented to person, place, and time. No cranial nerve deficit.  Skin: Skin is warm and dry.  Psychiatric: She has a normal mood and affect. Her behavior is normal.    ED Course  Procedures (including critical care time)   MDM  52 y.o. female with dental caries, dental pain and facial pain. Will treat with antibiotics and pain medication. Discussed with the patient need for follow up with dentist as soon as possible.  Discussed with the patient  clinical finding and need for follow up. All questioned fully answered.   Medication List    TAKE these medications       amoxicillin 500 MG capsule  Commonly known as:  AMOXIL  Take 1 capsule (500 mg total) by mouth 3 (three) times daily.     HYDROcodone-acetaminophen 5-325 MG per tablet  Commonly known as:  NORCO/VICODIN  Take 1 tablet by mouth every 4 (four) hours as needed.     ibuprofen 600 MG tablet  Commonly known as:  ADVIL,MOTRIN  Take 1 tablet (600 mg total) by mouth every 6 (six) hours as needed for pain.      ASK your doctor about these medications       CRANBERRY PO  Take 1 tablet by mouth daily.     lisinopril-hydrochlorothiazide 20-12.5 MG per tablet  Commonly known as:  PRINZIDE,ZESTORETIC  Take 1 tablet by mouth daily.               [facility] Orlene Och, [place] 02/20/13 [phone]

## 2013-02-20 NOTE — ED Notes (Signed)
Pressure and pain to right side of face. Headache also. Symptoms began yesterday.

## 2013-02-21 NOTE — ED Provider Notes (Signed)
Medical screening examination/treatment/procedure(s) were performed by non-physician practitioner and as supervising physician I was immediately available for consultation/collaboration.    Harish Bram D Meeah Totino, MD 02/21/13 1101 

## 2013-06-14 ENCOUNTER — Ambulatory Visit (INDEPENDENT_AMBULATORY_CARE_PROVIDER_SITE_OTHER): Payer: PRIVATE HEALTH INSURANCE | Admitting: Family Medicine

## 2013-06-14 ENCOUNTER — Encounter: Payer: Self-pay | Admitting: Family Medicine

## 2013-06-14 VITALS — BP 152/90 | Ht 67.5 in | Wt 218.0 lb

## 2013-06-14 DIAGNOSIS — I10 Essential (primary) hypertension: Secondary | ICD-10-CM

## 2013-06-14 MED ORDER — LISINOPRIL-HYDROCHLOROTHIAZIDE 20-12.5 MG PO TABS: 1.0000 | ORAL_TABLET | Freq: Every day | ORAL | Status: DC

## 2013-06-14 NOTE — Progress Notes (Signed)
  Subjective:    Patient ID: Denise Hill, female    DOB: 1960/11/28, 52 y.o.   MRN: [id number]  HPI Importance of medication compliance was discussed she ran out recently. We will get her refills she denies chest tightness pressure pain shortness breath swelling in the legs she does state she tries the healthy. She does try to stay physically active although does not exercise she does smoke she knows she needs to quit. Past medical history hypertension family history noncontributory   Review of Systems    see above Objective:   Physical Exam Her lungs are clear hearts regular pulse normal blood pressure is mildly elevated extremities no edema skin warm dry       Assessment & Plan:  Hypertension-resume medication prescription given plus also DASHdiet. She will followup regular physical activity as well followup sooner if any problems

## 2013-06-14 NOTE — Patient Instructions (Signed)
DASH Diet  The DASH diet stands for "Dietary Approaches to Stop Hypertension." It is a healthy eating plan that has been shown to reduce high blood pressure (hypertension) in as little as 14 days, while also possibly providing other significant health benefits. These other health benefits include reducing the risk of breast cancer after menopause and reducing the risk of type 2 diabetes, heart disease, colon cancer, and stroke. Health benefits also include weight loss and slowing kidney failure in patients with chronic kidney disease.   DIET GUIDELINES  · Limit salt (sodium). Your diet should contain less than 1500 mg of sodium daily.  · Limit refined or processed carbohydrates. Your diet should include mostly whole grains. Desserts and added sugars should be used sparingly.  · Include small amounts of heart-healthy fats. These types of fats include nuts, oils, and tub margarine. Limit saturated and trans fats. These fats have been shown to be harmful in the body.  CHOOSING FOODS   The following food groups are based on a 2000 calorie diet. See your Registered Dietitian for individual calorie needs.  Grains and Grain Products (6 to 8 servings daily)  · Eat More Often: Whole-wheat bread, brown rice, whole-grain or wheat pasta, quinoa, popcorn without added fat or salt (air popped).  · Eat Less Often: White bread, white pasta, white rice, cornbread.  Vegetables (4 to 5 servings daily)  · Eat More Often: Fresh, frozen, and canned vegetables. Vegetables may be raw, steamed, roasted, or grilled with a minimal amount of fat.  · Eat Less Often/Avoid: Creamed or fried vegetables. Vegetables in a cheese sauce.  Fruit (4 to 5 servings daily)  · Eat More Often: All fresh, canned (in natural juice), or frozen fruits. Dried fruits without added sugar. One hundred percent fruit juice (½ cup [237 mL] daily).  · Eat Less Often: Dried fruits with added sugar. Canned fruit in light or heavy syrup.  Lean Meats, Fish, and Poultry (2  servings or less daily. One serving is 3 to 4 oz [85-114 g]).  · Eat More Often: Ninety percent or leaner ground beef, tenderloin, sirloin. Round cuts of beef, chicken breast, turkey breast. All fish. Grill, bake, or broil your meat. Nothing should be fried.  · Eat Less Often/Avoid: Fatty cuts of meat, turkey, or chicken leg, thigh, or wing. Fried cuts of meat or fish.  Dairy (2 to 3 servings)  · Eat More Often: Low-fat or fat-free milk, low-fat plain or light yogurt, reduced-fat or part-skim cheese.  · Eat Less Often/Avoid: Milk (whole, 2%). Whole milk yogurt. Full-fat cheeses.  Nuts, Seeds, and Legumes (4 to 5 servings per week)  · Eat More Often: All without added salt.  · Eat Less Often/Avoid: Salted nuts and seeds, canned beans with added salt.  Fats and Sweets (limited)  · Eat More Often: Vegetable oils, tub margarines without trans fats, sugar-free gelatin. Mayonnaise and salad dressings.  · Eat Less Often/Avoid: Coconut oils, palm oils, butter, stick margarine, cream, half and half, cookies, candy, pie.  FOR MORE INFORMATION  The Dash Diet Eating Plan: www.dashdiet.org  Document Released: 08/13/2011 Document Revised: 11/16/2011 Document Reviewed: 08/13/2011  Exit[employer]® Patient Information ©2014 [employer].

## 2013-06-14 NOTE — Progress Notes (Signed)
  Subjective:    Patient ID: Denise Hill, female    DOB: [birth year], 52 y.o.   MRN: [id number]  Hypertension This is a chronic problem. The current episode started more than 1 year ago.      Review of Systems     Objective:   Physical Exam        Assessment & Plan:

## 2013-06-15 LAB — LIPID PANEL
Cholesterol: 251 mg/dL — ABNORMAL HIGH (ref 0–200)
HDL: 43 mg/dL (ref >39)
LDL Cholesterol: 145 mg/dL — ABNORMAL HIGH (ref 0–99)
Total CHOL/HDL Ratio: 5.8 Ratio
Triglycerides: 316 mg/dL — ABNORMAL HIGH (ref <150)
VLDL: 63 mg/dL — ABNORMAL HIGH (ref 0–40)

## 2013-06-15 LAB — BASIC METABOLIC PANEL
BUN: 11 mg/dL (ref 6–23)
CO2: 29 mEq/L (ref 19–32)
Calcium: 9.7 mg/dL (ref 8.4–10.5)
Chloride: 109 mEq/L (ref 96–112)
Creat: 0.72 mg/dL (ref 0.50–1.10)
Glucose, Bld: 93 mg/dL (ref 70–99)
Potassium: 4.3 mEq/L (ref 3.5–5.3)
Sodium: 142 mEq/L (ref 135–145)

## 2013-06-20 ENCOUNTER — Other Ambulatory Visit: Payer: Self-pay

## 2013-06-20 MED ORDER — PRAVASTATIN SODIUM 20 MG PO TABS: 20.0000 mg | ORAL_TABLET | Freq: Every evening | ORAL | Status: DC

## 2013-07-06 ENCOUNTER — Telehealth: Payer: Self-pay | Admitting: *Deleted

## 2013-07-06 NOTE — Telephone Encounter (Signed)
Pt made an appt for follow up on bloodwork.

## 2013-07-15 ENCOUNTER — Encounter: Payer: Self-pay | Admitting: *Deleted

## 2013-07-20 ENCOUNTER — Ambulatory Visit (INDEPENDENT_AMBULATORY_CARE_PROVIDER_SITE_OTHER): Payer: PRIVATE HEALTH INSURANCE | Admitting: Family Medicine

## 2013-07-20 ENCOUNTER — Encounter: Payer: Self-pay | Admitting: Family Medicine

## 2013-07-20 VITALS — BP 110/72 | Ht 67.5 in | Wt 218.0 lb

## 2013-07-20 DIAGNOSIS — E119 Type 2 diabetes mellitus without complications: Secondary | ICD-10-CM

## 2013-07-20 DIAGNOSIS — R7301 Impaired fasting glucose: Secondary | ICD-10-CM

## 2013-07-20 DIAGNOSIS — E785 Hyperlipidemia, unspecified: Secondary | ICD-10-CM

## 2013-07-20 DIAGNOSIS — Z79899 Other long term (current) drug therapy: Secondary | ICD-10-CM

## 2013-07-20 DIAGNOSIS — M170 Bilateral primary osteoarthritis of knee: Secondary | ICD-10-CM | POA: Insufficient documentation

## 2013-07-20 DIAGNOSIS — IMO0002 Reserved for concepts with insufficient information to code with codable children: Secondary | ICD-10-CM

## 2013-07-20 DIAGNOSIS — M1710 Unilateral primary osteoarthritis, unspecified knee: Secondary | ICD-10-CM

## 2013-07-20 MED ORDER — METFORMIN HCL 500 MG PO TABS: 500.0000 mg | ORAL_TABLET | Freq: Two times a day (BID) | ORAL | Status: DC

## 2013-07-20 MED ORDER — DICLOFENAC SODIUM 75 MG PO TBEC: 75.0000 mg | DELAYED_RELEASE_TABLET | Freq: Two times a day (BID) | ORAL | Status: DC

## 2013-07-20 NOTE — Progress Notes (Signed)
  Subjective:    Patient ID: Denise Hill, female    DOB: 02-11-1961, 52 y.o.   MRN: [id number]  HPIHere for a follow up on bloodwork. This patient had [facility] profile done at [facility] but she also had it done at her workplace her LDL is way too elevated and A1c was 7.0 she has a family history hypertension diabetes hyperlipidemia the patient also relates that she tries to eat healthy she does not exercise as much as she liked she does take her medicine as directed.  Bilateral knee and ankle pain. Started over 1 year ago. She is on her feet a lot causes pain and discomfort in the knees and the ankles. Causes her to not feel good. She would like to try something to help with her pain.    Flu vaccine at [facility].     Review of Systems    see above she denies chest tightness pressure pain shortness breath nausea vomiting diarrhea rectal bleeding Objective:   Physical Exam Lungs clear hearts regular abdomen soft extremities no edema foot exam normal pulses normal       Assessment & Plan:  #1 diabetes-dietitian education nurse gave information, start metformin 500 mg half tablet twice daily warning signs side effects discussed, recheck lab work 3 months with followup office visit #2 hyperlipidemia continue pravastatin just recently started check lab work in 8-12 weeks followup office visit in 3 months side effects discussed #3 HTN good control continue current measures #4 importance of exercise losing weight discussed. #5 probable osteoarthritis in the knees and ankles, Voltaren 75 mg twice a day

## 2013-07-20 NOTE — Patient Instructions (Signed)
DASH Diet The DASH diet stands for "Dietary Approaches to Stop Hypertension." It is a healthy eating plan that has been shown to reduce high blood pressure (hypertension) in as little as 14 days, while also possibly providing other significant health benefits. These other health benefits include reducing the risk of breast cancer after menopause and reducing the risk of type 2 diabetes, heart disease, colon cancer, and stroke. Health benefits also include weight loss and slowing kidney failure in patients with chronic kidney disease.  DIET GUIDELINES  Limit salt (sodium). Your diet should contain less than 1500 mg of sodium daily.  Limit refined or processed carbohydrates. Your diet should include mostly whole grains. Desserts and added sugars should be used sparingly.  Include small amounts of heart-healthy fats. These types of fats include nuts, oils, and tub margarine. Limit saturated and trans fats. These fats have been shown to be harmful in the body. CHOOSING FOODS  The following food groups are based on a 2000 calorie diet. See your Registered Dietitian for individual calorie needs. Grains and Grain Products (6 to 8 servings daily)  Eat More Often: Whole-wheat bread, brown rice, whole-grain or wheat pasta, quinoa, popcorn without added fat or salt (air popped).  Eat Less Often: White bread, white pasta, white rice, cornbread. Vegetables (4 to 5 servings daily)  Eat More Often: Fresh, frozen, and canned vegetables. Vegetables may be raw, steamed, roasted, or grilled with a minimal amount of fat.  Eat Less Often/Avoid: Creamed or fried vegetables. Vegetables in a cheese sauce. Fruit (4 to 5 servings daily)  Eat More Often: All fresh, canned (in natural juice), or frozen fruits. Dried fruits without added sugar. One hundred percent fruit juice ( cup [237 mL] daily).  Eat Less Often: Dried fruits with added sugar. Canned fruit in light or heavy syrup. Lean Meats, Fish, and Poultry (2  servings or less daily. One serving is 3 to 4 oz [85-114 g]).  Eat More Often: Ninety percent or leaner ground beef, tenderloin, sirloin. Round cuts of beef, chicken breast, turkey breast. All fish. Grill, bake, or broil your meat. Nothing should be fried.  Eat Less Often/Avoid: Fatty cuts of meat, turkey, or chicken leg, thigh, or wing. Fried cuts of meat or fish. Dairy (2 to 3 servings)  Eat More Often: Low-fat or fat-free milk, low-fat plain or light yogurt, reduced-fat or part-skim cheese.  Eat Less Often/Avoid: Milk (whole, 2%).Whole milk yogurt. Full-fat cheeses. Nuts, Seeds, and Legumes (4 to 5 servings per week)  Eat More Often: All without added salt.  Eat Less Often/Avoid: Salted nuts and seeds, canned beans with added salt. Fats and Sweets (limited)  Eat More Often: Vegetable oils, tub margarines without trans fats, sugar-free gelatin. Mayonnaise and salad dressings.  Eat Less Often/Avoid: Coconut oils, palm oils, butter, stick margarine, cream, half and half, cookies, candy, pie. FOR MORE INFORMATION The Dash Diet Eating Plan: www.dashdiet.org Document Released: 08/13/2011 Document Revised: 11/16/2011 Document Reviewed: 08/13/2011 [employer] Patient Information 2014 [employer]. Diabetes Meal Planning Guide The diabetes meal planning guide is a tool to help you plan your meals and snacks. It is important for people with diabetes to manage their blood glucose (sugar) levels. Choosing the right foods and the right amounts throughout your day will help control your blood glucose. Eating right can even help you improve your blood pressure and reach or maintain a healthy weight. CARBOHYDRATE COUNTING MADE EASY When you eat carbohydrates, they turn to sugar. This raises your blood glucose level. Counting carbohydrates   can help you control this level so you feel better. When you plan your meals by counting carbohydrates, you can have more flexibility in what you eat and balance  your medicine with your food intake. Carbohydrate counting simply means adding up the total amount of carbohydrate grams in your meals and snacks. Try to eat about the same amount at each meal. Foods with carbohydrates are listed below. Each portion below is 1 carbohydrate serving or 15 grams of carbohydrates. Ask your dietician how many grams of carbohydrates you should eat at each meal or snack. Grains and Starches  1 slice bread.   English muffin or hotdog/hamburger bun.   cup cold cereal (unsweetened).   cup cooked pasta or rice.   cup starchy vegetables (corn, potatoes, peas, beans, winter squash).  1 tortilla (6 inches).   bagel.  1 waffle or pancake (size of a CD).   cup cooked cereal.  4 to 6 small crackers. *Whole grain is recommended. Fruit  1 cup fresh unsweetened berries, melon, papaya, pineapple.  1 small fresh fruit.   banana or mango.   cup fruit juice (4 oz unsweetened).   cup canned fruit in natural juice or water.  2 tbs dried fruit.  12 to 15 grapes or cherries. Milk and Yogurt  1 cup fat-free or 1% milk.  1 cup soy milk.  6 oz light yogurt with sugar-free sweetener.  6 oz low-fat soy yogurt.  6 oz plain yogurt. Vegetables  1 cup raw or  cup cooked is counted as 0 carbohydrates or a "free" food.  If you eat 3 or more servings at 1 meal, count them as 1 carbohydrate serving. Other Carbohydrates   oz chips or pretzels.   cup ice cream or frozen yogurt.   cup sherbet or sorbet.  2 inch square cake, no frosting.  1 tbs honey, sugar, jam, jelly, or syrup.  2 small cookies.  3 squares of graham crackers.  3 cups popcorn.  6 crackers.  1 cup broth-based soup.  Count 1 cup casserole or other mixed foods as 2 carbohydrate servings.  Foods with less than 20 calories in a serving may be counted as 0 carbohydrates or a "free" food. You may want to purchase a book or computer software that lists the carbohydrate gram  counts of different foods. In addition, the nutrition facts panel on the labels of the foods you eat are a good source of this information. The label will tell you how big the serving size is and the total number of carbohydrate grams you will be eating per serving. Divide this number by 15 to obtain the number of carbohydrate servings in a portion. Remember, 1 carbohydrate serving equals 15 grams of carbohydrate. SERVING SIZES Measuring foods and serving sizes helps you make sure you are getting the right amount of food. The list below tells how big or small some common serving sizes are.  1 oz.........4 stacked dice.  3 oz.........Deck of cards.  1 tsp........Tip of little finger.  1 tbs........Thumb.  2 tbs........Golf ball.   cup.......Half of a fist.  1 cup........A fist. SAMPLE DIABETES MEAL PLAN Below is a sample meal plan that includes foods from the grain and starches, dairy, vegetable, fruit, and meat groups. A dietician can individualize a meal plan to fit your calorie needs and tell you the number of servings needed from each food group. However, controlling the total amount of carbohydrates in your meal or snack is more important than making sure you   include all of the food groups at every meal. You may interchange carbohydrate containing foods (dairy, starches, and fruits). The meal plan below is an example of a 2000 calorie diet using carbohydrate counting. This meal plan has 17 carbohydrate servings. Breakfast  1 cup oatmeal (2 carb servings).   cup light yogurt (1 carb serving).  1 cup blueberries (1 carb serving).   cup almonds. Snack  1 large apple (2 carb servings).  1 low-fat string cheese stick. Lunch  Chicken breast salad.  1 cup spinach.   cup chopped tomatoes.  2 oz chicken breast, sliced.  2 tbs low-fat Italian dressing.  12 whole-wheat crackers (2 carb servings).  12 to 15 grapes (1 carb serving).  1 cup low-fat milk (1 carb  serving). Snack  1 cup carrots.   cup hummus (1 carb serving). Dinner  3 oz broiled salmon.  1 cup brown rice (3 carb servings). Snack  1  cups steamed broccoli (1 carb serving) drizzled with 1 tsp olive oil and lemon juice.  1 cup light pudding (2 carb servings). DIABETES MEAL PLANNING WORKSHEET Your dietician can use this worksheet to help you decide how many servings of foods and what types of foods are right for you.  BREAKFAST Food Group and Servings / Carb Servings Grain/Starches __________________________________ Dairy __________________________________________ Vegetable ______________________________________ Fruit ___________________________________________ Meat __________________________________________ Fat ____________________________________________ LUNCH Food Group and Servings / Carb Servings Grain/Starches ___________________________________ Dairy ___________________________________________ Fruit ____________________________________________ Meat ___________________________________________ Fat _____________________________________________ DINNER Food Group and Servings / Carb Servings Grain/Starches ___________________________________ Dairy ___________________________________________ Fruit ____________________________________________ Meat ___________________________________________ Fat _____________________________________________ SNACKS Food Group and Servings / Carb Servings Grain/Starches ___________________________________ Dairy ___________________________________________ Vegetable _______________________________________ Fruit ____________________________________________ Meat ___________________________________________ Fat _____________________________________________ DAILY TOTALS Starches _________________________ Vegetable ________________________ Fruit ____________________________ Dairy ____________________________ Meat  ____________________________ Fat ______________________________ Document Released: 05/21/2005 Document Revised: 11/16/2011 Document Reviewed: 04/01/2009 [employer] Patient Information 2014 [employer].  

## 2014-01-08 ENCOUNTER — Ambulatory Visit: Payer: PRIVATE HEALTH INSURANCE | Admitting: Family Medicine

## 2014-01-23 ENCOUNTER — Ambulatory Visit (INDEPENDENT_AMBULATORY_CARE_PROVIDER_SITE_OTHER): Payer: 59 | Admitting: Family Medicine

## 2014-01-23 ENCOUNTER — Encounter: Payer: Self-pay | Admitting: Family Medicine

## 2014-01-23 VITALS — BP 132/84 | Ht 67.5 in | Wt 217.0 lb

## 2014-01-23 DIAGNOSIS — Z23 Encounter for immunization: Secondary | ICD-10-CM

## 2014-01-23 DIAGNOSIS — E785 Hyperlipidemia, unspecified: Secondary | ICD-10-CM

## 2014-01-23 DIAGNOSIS — I10 Essential (primary) hypertension: Secondary | ICD-10-CM

## 2014-01-23 DIAGNOSIS — S76219A Strain of adductor muscle, fascia and tendon of unspecified thigh, initial encounter: Secondary | ICD-10-CM

## 2014-01-23 DIAGNOSIS — Z1231 Encounter for screening mammogram for malignant neoplasm of breast: Secondary | ICD-10-CM

## 2014-01-23 DIAGNOSIS — E119 Type 2 diabetes mellitus without complications: Secondary | ICD-10-CM

## 2014-01-23 LAB — POCT GLYCOSYLATED HEMOGLOBIN (HGB A1C): Hemoglobin A1C: 6.3

## 2014-01-23 NOTE — Progress Notes (Signed)
   Subjective:    Patient ID: Denise Hill, female    DOB: Oct 17, 1960, 53 y.o.   MRN: [id number]  Diabetes She presents for her follow-up diabetic visit. She has type 2 diabetes mellitus. Pertinent negatives for hypoglycemia include no confusion. Associated symptoms include chest pain. Pertinent negatives for diabetes include no fatigue, no polydipsia, no polyphagia and no weakness. Symptoms are improving. Symptoms have been present for 1 week. Current diabetic treatments: metformin. She is compliant with treatment most of the time. She never participates in exercise. Her breakfast blood glucose range is generally 110-130 mg/dl. Eye exam is not current.   Groin pain on left side and chest pain. Started about 1 week ago. Hurts with certain movements. Denies any injury.  Migraines. Off and on for years. Getting worse. Taking advil pm. And ibuprofen.  No relief. She does not get enough sleep in regards to hours in a row. In addition to this she also at times works 2 jobs plus in addition to this she has frequent headaches she denies any blurred vision nausea or vomiting with them. They do not wake her up from her sleep. She does relate that she drinks too many sodas per day  Review of Systems  Constitutional: Negative for activity change, appetite change and fatigue.  HENT: Negative for congestion.   Respiratory: Negative for cough.   Cardiovascular: Positive for chest pain.  Gastrointestinal: Negative for abdominal pain.  Endocrine: Negative for polydipsia and polyphagia.  Genitourinary: Negative for frequency.  Neurological: Negative for weakness.  Psychiatric/Behavioral: Negative for confusion.       Objective:   Physical Exam  Vitals reviewed. Constitutional: She appears well-nourished. No distress.  Cardiovascular: Normal rate, regular rhythm and normal heart sounds.   No murmur heard. Pulmonary/Chest: Effort normal and breath sounds normal. No respiratory distress.    Musculoskeletal: She exhibits no edema.  Lymphadenopathy:    She has no cervical adenopathy.  Neurological: She is alert. She exhibits normal muscle tone.  Psychiatric: Her behavior is normal.          Assessment & Plan:  1. Diabetes A1c is actually improved watch diet stay physically active. Try to bring weight down. Continue metformin - POCT glycosylated hemoglobin (Hb A1C)  2. Essential hypertension, benign Blood pressure is good continue current medication watch diet stay active - Hepatic function panel - Basic metabolic panel  3. Type 2 diabetes mellitus She does need to check urine micro-protein. Watch diet - Microalbumin, urine  4. Hyperlipidemia Patient stopped taking statins in because she said it made her tongue single really look at lipid profile. - Lipid panel  5. Other screening mammogram Due for mammogram screening - MM Digital Screening  6. Need for prophylactic vaccination with combined diphtheria-tetanus-pertussis (DTP) vaccine Due for tetanus shot - Td vaccine greater than or equal to 7yo preservative free IM  7. Need for prophylactic vaccination against Streptococcus pneumoniae (pneumococcus) Due for a pneumonia vaccine - Pneumococcal polysaccharide vaccine 23-valent greater than or equal to 2yo subcutaneous/IM; Standing - Pneumococcal polysaccharide vaccine 23-valent greater than or equal to 2yo subcutaneous/IM  8. Groin strain Should gradually get better with anti-inflammatories stretching. Followup if ongoing  Recheck patient in 4-6 months

## 2014-01-23 NOTE — Patient Instructions (Signed)
Hypertension  As your heart beats, it forces blood through your arteries. This force is your blood pressure. If the pressure is too high, it is called hypertension (HTN) or high blood pressure. HTN is dangerous because you may have it and not know it. High blood pressure may mean that your heart has to work harder to pump blood. Your arteries may be narrow or stiff. The extra work puts you at risk for heart disease, stroke, and other problems.   Blood pressure consists of two numbers, a higher number over a lower, 110/72, for example. It is stated as "110 over 72." The ideal is below 120 for the top number (systolic) and under 80 for the bottom (diastolic). Write down your blood pressure today.  You should pay close attention to your blood pressure if you have certain conditions such as:   Heart failure.   Prior heart attack.   Diabetes   Chronic kidney disease.   Prior stroke.   Multiple risk factors for heart disease.  To see if you have HTN, your blood pressure should be measured while you are seated with your arm held at the level of the heart. It should be measured at least twice. A one-time elevated blood pressure reading (especially in the Emergency Department) does not mean that you need treatment. There may be conditions in which the blood pressure is different between your right and left arms. It is important to see your caregiver soon for a recheck.  Most people have essential hypertension which means that there is not a specific cause. This type of high blood pressure may be lowered by changing lifestyle factors such as:   Stress.   Smoking.   Lack of exercise.   Excessive weight.   Drug/tobacco/alcohol use.   Eating less salt.  Most people do not have symptoms from high blood pressure until it has caused damage to the body. Effective treatment can often prevent, delay or reduce that damage.  TREATMENT    When a cause has been identified, treatment for high blood pressure is directed at the cause. There are a large number of medications to treat HTN. These fall into several categories, and your caregiver will help you select the medicines that are best for you. Medications may have side effects. You should review side effects with your caregiver.  If your blood pressure stays high after you have made lifestyle changes or started on medicines,    Your medication(s) may need to be changed.   Other problems may need to be addressed.   Be certain you understand your prescriptions, and know how and when to take your medicine.   Be sure to follow up with your caregiver within the time frame advised (usually within two weeks) to have your blood pressure rechecked and to review your medications.   If you are taking more than one medicine to lower your blood pressure, make sure you know how and at what times they should be taken. Taking two medicines at the same time can result in blood pressure that is too low.  SEEK IMMEDIATE MEDICAL CARE IF:   You develop a severe headache, blurred or changing vision, or confusion.   You have unusual weakness or numbness, or a faint feeling.   You have severe chest or abdominal pain, vomiting, or breathing problems.  MAKE SURE YOU:    Understand these instructions.   Will watch your condition.   Will get help right away if you are not doing well   or get worse.  Document Released: 08/24/2005 Document Revised: 11/16/2011 Document Reviewed: 04/13/2008  Exit[employer] Patient Information 2014 [employer]  DASH Diet   The DASH diet stands for "Dietary Approaches to Stop Hypertension." It is a healthy eating plan that has been shown to reduce high blood pressure (hypertension) in as little as 14 days, while also possibly providing other significant health benefits. These other health benefits include reducing the risk of breast cancer after menopause and reducing the risk of type 2 diabetes, heart disease, colon cancer, and stroke. Health benefits also include weight loss and slowing kidney failure in patients with chronic kidney disease.   DIET GUIDELINES   Limit salt (sodium). Your diet should contain less than 1500 mg of sodium daily.   Limit refined or processed carbohydrates. Your diet should include mostly whole grains. Desserts and added sugars should be used sparingly.   Include small amounts of heart-healthy fats. These types of fats include nuts, oils, and tub margarine. Limit saturated and trans fats. These fats have been shown to be harmful in the body.  CHOOSING FOODS   The following food groups are based on a 2000 calorie diet. See your Registered Dietitian for individual calorie needs.  Grains and Grain Products (6 to 8 servings daily)   Eat More Often: Whole-wheat bread, brown rice, whole-grain or wheat pasta, quinoa, popcorn without added fat or salt (air popped).   Eat Less Often: White bread, white pasta, white rice, cornbread.  Vegetables (4 to 5 servings daily)   Eat More Often: Fresh, frozen, and canned vegetables. Vegetables may be raw, steamed, roasted, or grilled with a minimal amount of fat.   Eat Less Often/Avoid: Creamed or fried vegetables. Vegetables in a cheese sauce.  Fruit (4 to 5 servings daily)   Eat More Often: All fresh, canned (in natural juice), or frozen fruits. Dried fruits without added sugar. One hundred percent fruit juice ( cup [237 mL] daily).   Eat Less Often: Dried fruits with added sugar. Canned fruit in light or heavy syrup.   Lean Meats, Fish, and Poultry (2 servings or less daily. One serving is 3 to 4 oz [85-114 g]).   Eat More Often: Ninety percent or leaner ground beef, tenderloin, sirloin. Round cuts of beef, chicken breast, turkey breast. All fish. Grill, bake, or broil your meat. Nothing should be fried.   Eat Less Often/Avoid: Fatty cuts of meat, turkey, or chicken leg, thigh, or wing. Fried cuts of meat or fish.  Dairy (2 to 3 servings)   Eat More Often: Low-fat or fat-free milk, low-fat plain or light yogurt, reduced-fat or part-skim cheese.   Eat Less Often/Avoid: Milk (whole, 2%).Whole milk yogurt. Full-fat cheeses.  Nuts, Seeds, and Legumes (4 to 5 servings per week)   Eat More Often: All without added salt.   Eat Less Often/Avoid: Salted nuts and seeds, canned beans with added salt.  Fats and Sweets (limited)   Eat More Often: Vegetable oils, tub margarines without trans fats, sugar-free gelatin. Mayonnaise and salad dressings.   Eat Less Often/Avoid: Coconut oils, palm oils, butter, stick margarine, cream, half and half, cookies, candy, pie.  FOR MORE INFORMATION  The Dash Diet Eating Plan: www.dashdiet.org  Document Released: 08/13/2011 Document Revised: 11/16/2011 Document Reviewed: 08/13/2011  [employer] Patient Information 2014 [employer].

## 2014-01-25 ENCOUNTER — Ambulatory Visit (HOSPITAL_COMMUNITY)
Admission: RE | Admit: 2014-01-25 | Discharge: 2014-01-25 | Disposition: A | Discharge status: Home / Self Care | Payer: 59 | Source: Ambulatory Visit | Attending: Family Medicine | Admitting: Family Medicine

## 2014-01-25 DIAGNOSIS — Z1231 Encounter for screening mammogram for malignant neoplasm of breast: Secondary | ICD-10-CM | POA: Insufficient documentation

## 2014-01-30 ENCOUNTER — Encounter (INDEPENDENT_AMBULATORY_CARE_PROVIDER_SITE_OTHER): Payer: Self-pay | Admitting: *Deleted

## 2014-01-30 ENCOUNTER — Other Ambulatory Visit (INDEPENDENT_AMBULATORY_CARE_PROVIDER_SITE_OTHER): Payer: Self-pay | Admitting: *Deleted

## 2014-01-30 DIAGNOSIS — Z1211 Encounter for screening for malignant neoplasm of colon: Secondary | ICD-10-CM

## 2014-01-31 LAB — HEPATIC FUNCTION PANEL
ALT: 15 U/L (ref 0–35)
AST: 17 U/L (ref 0–37)
Albumin: 4.1 g/dL (ref 3.5–5.2)
Alkaline Phosphatase: 70 U/L (ref 39–117)
Bilirubin, Direct: 0.1 mg/dL (ref 0.0–0.3)
Indirect Bilirubin: 0.3 mg/dL (ref 0.2–1.2)
Total Bilirubin: 0.4 mg/dL (ref 0.2–1.2)
Total Protein: 7 g/dL (ref 6.0–8.3)

## 2014-01-31 LAB — LIPID PANEL
Cholesterol: 274 mg/dL — ABNORMAL HIGH (ref 0–200)
HDL: 42 mg/dL (ref >39)
LDL Cholesterol: 196 mg/dL — ABNORMAL HIGH (ref 0–99)
Total CHOL/HDL Ratio: 6.5 Ratio
Triglycerides: 179 mg/dL — ABNORMAL HIGH (ref <150)
VLDL: 36 mg/dL (ref 0–40)

## 2014-01-31 LAB — BASIC METABOLIC PANEL
BUN: 11 mg/dL (ref 6–23)
CO2: 28 mEq/L (ref 19–32)
Calcium: 9.7 mg/dL (ref 8.4–10.5)
Chloride: 105 mEq/L (ref 96–112)
Creat: 0.67 mg/dL (ref 0.50–1.10)
Glucose, Bld: 108 mg/dL — ABNORMAL HIGH (ref 70–99)
Potassium: 4.3 mEq/L (ref 3.5–5.3)
Sodium: 139 mEq/L (ref 135–145)

## 2014-01-31 LAB — MICROALBUMIN, URINE: Microalb, Ur: 1.01 mg/dL (ref 0.00–1.89)

## 2014-02-13 MED ORDER — ATORVASTATIN CALCIUM 20 MG PO TABS: 20.0000 mg | ORAL_TABLET | Freq: Every day | ORAL | Status: DC

## 2014-02-13 NOTE — Addendum Note (Signed)
Addended byCharolotte Capuchin D on: 02/13/2014 09:04 AM   Modules accepted: Orders

## 2014-02-13 NOTE — Progress Notes (Signed)
Med sent. Pt notified.  

## 2014-02-19 ENCOUNTER — Telehealth (INDEPENDENT_AMBULATORY_CARE_PROVIDER_SITE_OTHER): Payer: Self-pay | Admitting: *Deleted

## 2014-02-19 DIAGNOSIS — Z1211 Encounter for screening for malignant neoplasm of colon: Secondary | ICD-10-CM

## 2014-02-19 NOTE — Telephone Encounter (Signed)
Patient needs movi prep 

## 2014-02-28 MED ORDER — PEG-KCL-NACL-NASULF-NA ASC-C 100 G PO SOLR: 1.0000 | Freq: Once | ORAL | Status: DC

## 2014-04-03 ENCOUNTER — Telehealth (INDEPENDENT_AMBULATORY_CARE_PROVIDER_SITE_OTHER): Payer: Self-pay | Admitting: *Deleted

## 2014-04-03 NOTE — Telephone Encounter (Signed)
  Procedure: tcs  Reason/Indication:  screening  Has patient had this procedure before?  no  If so, when, by whom and where?    Is there a family history of colon cancer?  no  Who?  What age when diagnosed?    Is patient diabetic?   Yes       Does patient have prosthetic heart valve?  no  Do you have a pacemaker?  no  Has patient ever had endocarditis? no  Has patient had joint replacement within last 12 months?  no  Does patient tend to be constipated or take laxatives? no  Is patient on Coumadin, Plavix and/or Aspirin? no  Medications: metformin 500 mg 1.2 tab bid, diclofenac 35 mg bid, lisinopril/hctz 20/12 mg daily, one a day vitamin  Allergies: pravastatin  Medication Adjustment: hold metformin evening before and morning of  Procedure date & time: 05/03/14 at 830

## 2014-04-04 NOTE — Telephone Encounter (Signed)
agree

## 2014-04-09 ENCOUNTER — Encounter (HOSPITAL_COMMUNITY): Payer: Self-pay | Admitting: Pharmacy Technician

## 2014-05-02 ENCOUNTER — Other Ambulatory Visit (INDEPENDENT_AMBULATORY_CARE_PROVIDER_SITE_OTHER): Payer: Self-pay | Admitting: *Deleted

## 2014-05-02 DIAGNOSIS — Z1211 Encounter for screening for malignant neoplasm of colon: Secondary | ICD-10-CM

## 2014-05-03 ENCOUNTER — Ambulatory Visit (HOSPITAL_COMMUNITY)
Admission: RE | Admit: 2014-05-03 | Discharge: 2014-05-03 | Disposition: A | Discharge status: Home / Self Care | Payer: 59 | Source: Ambulatory Visit | Attending: Internal Medicine | Admitting: Internal Medicine

## 2014-05-03 ENCOUNTER — Encounter (HOSPITAL_COMMUNITY)
Admission: RE | Disposition: A | Discharge status: Home / Self Care | Payer: Self-pay | Source: Ambulatory Visit | Attending: Internal Medicine

## 2014-05-03 ENCOUNTER — Encounter (HOSPITAL_COMMUNITY): Payer: Self-pay | Admitting: *Deleted

## 2014-05-03 DIAGNOSIS — E119 Type 2 diabetes mellitus without complications: Secondary | ICD-10-CM | POA: Diagnosis not present

## 2014-05-03 DIAGNOSIS — E7800 Pure hypercholesterolemia, unspecified: Secondary | ICD-10-CM | POA: Diagnosis not present

## 2014-05-03 DIAGNOSIS — I10 Essential (primary) hypertension: Secondary | ICD-10-CM | POA: Insufficient documentation

## 2014-05-03 DIAGNOSIS — Z791 Long term (current) use of non-steroidal anti-inflammatories (NSAID): Secondary | ICD-10-CM | POA: Insufficient documentation

## 2014-05-03 DIAGNOSIS — Z1211 Encounter for screening for malignant neoplasm of colon: Secondary | ICD-10-CM | POA: Diagnosis present

## 2014-05-03 DIAGNOSIS — F17200 Nicotine dependence, unspecified, uncomplicated: Secondary | ICD-10-CM | POA: Diagnosis not present

## 2014-05-03 DIAGNOSIS — D126 Benign neoplasm of colon, unspecified: Secondary | ICD-10-CM | POA: Diagnosis not present

## 2014-05-03 DIAGNOSIS — Z79899 Other long term (current) drug therapy: Secondary | ICD-10-CM | POA: Insufficient documentation

## 2014-05-03 HISTORY — PX: COLONOSCOPY: SHX5424

## 2014-05-03 HISTORY — DX: Pure hypercholesterolemia, unspecified: E78.00

## 2014-05-03 HISTORY — DX: Type 2 diabetes mellitus without complications: E11.9

## 2014-05-03 LAB — GLUCOSE, CAPILLARY: Glucose-Capillary: 118 mg/dL — ABNORMAL HIGH (ref 70–99)

## 2014-05-03 SURGERY — COLONOSCOPY
Anesthesia: Moderate Sedation

## 2014-05-03 MED ORDER — SODIUM CHLORIDE 0.9 % IV SOLN
INTRAVENOUS | Status: DC
  Administered 2014-05-03: 08:00:00 via INTRAVENOUS

## 2014-05-03 MED ORDER — MEPERIDINE HCL 50 MG/ML IJ SOLN
INTRAMUSCULAR | Status: AC
  Filled 2014-05-03: qty 1

## 2014-05-03 MED ORDER — MIDAZOLAM HCL 5 MG/5ML IJ SOLN
INTRAMUSCULAR | Status: DC | PRN
  Administered 2014-05-03 (×3): 2 mg via INTRAVENOUS

## 2014-05-03 MED ORDER — MIDAZOLAM HCL 5 MG/5ML IJ SOLN
INTRAMUSCULAR | Status: AC
  Filled 2014-05-03: qty 10

## 2014-05-03 MED ORDER — MEPERIDINE HCL 50 MG/ML IJ SOLN
INTRAMUSCULAR | Status: DC | PRN
  Administered 2014-05-03 (×2): 25 mg via INTRAVENOUS

## 2014-05-03 NOTE — H&P (Signed)
Denise Hill is an 53 y.o. female.   Chief Complaint: Patient is here for colonoscopy. HPI: Patient is a 53 year old in for screening colonoscopy. She denies abdominal pain change in bowel habits or rectal bleeding. Family history is negative for CRC.  Past Medical History  Diagnosis Date  . Hypertension   . Aneurysm 2002    coil / clip  [facility]  . Chronic headaches   . Hypercholesteremia   . Diabetes mellitus without complication     Past Surgical History  Procedure Laterality Date  . Cerebral aneurysm repair  [place]    Right side removal  . Tubal ligation  1990;1992  . Dilation and curettage of uterus    . Appendectomy  1980  . Cervix surgery  1990    Freezing of cervical cells  . Cholecystectomy  01/18/2012    Procedure: LAPAROSCOPIC CHOLECYSTECTOMY;  Surgeon: Donato Heinz, MD;  Location: [facility];  Service: General;  Laterality: N/A;    Family History  Problem Relation Age of Onset  . Hypertension Mother   . Diabetes Mother   . Hyperlipidemia Mother   . Hypertension Sister   . Hypertension Sister   . Colon cancer Neg Hx    Social History:  reports that she has been smoking Cigarettes.  She has a 38 pack-year smoking history. She does not have any smokeless tobacco history on file. She reports that she does not drink alcohol or use illicit drugs.  Allergies:  Allergies  Allergen Reactions  . Pravastatin     Tingling in mouth     Medications Prior to Admission  Medication Sig Dispense Refill  . atorvastatin (LIPITOR) 20 MG tablet Take 1 tablet (20 mg total) by mouth daily at 6 PM.  30 tablet  4  . diclofenac (VOLTAREN) 75 MG EC tablet Take 75 mg by mouth 2 (two) times daily as needed. pain      . ibuprofen (ADVIL,MOTRIN) 200 MG tablet Take 200 mg by mouth every 8 (eight) hours as needed. pain      . lisinopril-hydrochlorothiazide (PRINZIDE,ZESTORETIC) 20-12.5 MG per tablet Take 1 tablet by mouth daily.   30 tablet  6  . metFORMIN (GLUCOPHAGE) 500 MG tablet Take 250 mg by mouth 2 (two) times daily with a meal.      . peg 3350 powder (MOVIPREP) 100 G SOLR Take 1 kit (200 g total) by mouth once.  1 kit  0    Results for orders placed during the hospital encounter of 05/03/14 (from the past 48 hour(s))  GLUCOSE, CAPILLARY     Status: Abnormal   Collection Time    05/03/14  7:57 AM      Result Value Ref Range   Glucose-Capillary 118 (*) 70 - 99 mg/dL   Comment 1 Documented in Chart     No results found.  ROS  Blood pressure 146/99, pulse 89, temperature 97.8 F (36.6 C), temperature source Oral, height $RemoveBefo'5\' 8"'eBCBXjIArGH$  (1.727 m), weight 211 lb (95.709 kg), last menstrual period 01/15/2012, SpO2 100.00%. Physical Exam  Constitutional: She appears well-developed and well-nourished.  HENT:  Mouth/Throat: Oropharynx is clear and moist.  Eyes: Conjunctivae are normal. No scleral icterus.  Neck: No thyromegaly present.  Cardiovascular: Normal rate, regular rhythm and normal heart sounds.   No murmur heard. Respiratory: Effort normal and breath sounds normal.  GI: Soft. She exhibits no distension. Tenderness: mild tenderness at RLQ.  Musculoskeletal: She exhibits no  edema.  Lymphadenopathy:    She has no cervical adenopathy.  Neurological: She is alert.  Skin: Skin is warm and dry.     Assessment/Plan Average risk screening colonoscopy.  Denise Hill U 05/03/2014, 8:32 AM

## 2014-05-03 NOTE — Op Note (Signed)
COLONOSCOPY PROCEDURE REPORT  PATIENT:  Denise Hill  MR#:  [id number] Birthdate:  [birth year], 53 y.o., female Endoscopist:  Dr. Rogene Houston, MD Referred By:  Dr. Sallee Lange, MD  Procedure Date: 05/03/2014  Procedure:   Colonoscopy  Indications:  Patient is 53 year old African American female who is undergoing average risk screening colonoscopy.  Informed Consent:  The procedure and risks were reviewed with the patient and informed consent was obtained.  Medications:  Demerol 50 mg IV Versed 6 mg IV  Description of procedure:  After a digital rectal exam was performed, that colonoscope was advanced from the anus through the rectum and colon to the area of the cecum, ileocecal valve and appendiceal orifice. The cecum was deeply intubated. These structures were well-seen and photographed for the record. From the level of the cecum and ileocecal valve, the scope was slowly and cautiously withdrawn. The mucosal surfaces were carefully surveyed utilizing scope tip to flexion to facilitate fold flattening as needed. The scope was pulled down into the rectum where a thorough exam including retroflexion was performed.  Findings:   Prep satisfactory. Small polyp ablated via cold biopsy colon from mid sigmoid colon. Normal rectal mucosa and anal rectal junction.   Therapeutic/Diagnostic Maneuvers Performed:  See above  Complications:  None  Cecal Withdrawal Time:  9 minutes  Impression:  Examination performed to cecum. Small polyp ablated via cold biopsy from the sigmoid colon.  Recommendations:  Standard instructions given. I will contact patient with biopsy results and further recommendations.  Barbarajean Kinzler U  05/03/2014 9:01 AM  CC: Dr. Sallee Lange, MD & Dr. Rayne Du ref. provider found

## 2014-05-03 NOTE — Discharge Instructions (Signed)
Resume usual medications and diet. No driving for 24 hours. Physician will call with biopsy results.  Colonoscopy, Care After Refer to this sheet in the next few weeks. These instructions provide you with information on caring for yourself after your procedure. Your health care provider may also give you more specific instructions. Your treatment has been planned according to current medical practices, but problems sometimes occur. Call your health care provider if you have any problems or questions after your procedure. WHAT TO EXPECT AFTER THE PROCEDURE  After your procedure, it is typical to have the following:  A small amount of blood in your stool.  Moderate amounts of gas and mild abdominal cramping or bloating. HOME CARE INSTRUCTIONS  Do not drive, operate machinery, or sign important documents for 24 hours.  You may shower and resume your regular physical activities, but move at a slower pace for the first 24 hours.  Take frequent rest periods for the first 24 hours.  Walk around or put a warm pack on your abdomen to help reduce abdominal cramping and bloating.  Drink enough fluids to keep your urine clear or pale yellow.  You may resume your normal diet as instructed by your health care provider. Avoid heavy or fried foods that are hard to digest.  Avoid drinking alcohol for 24 hours or as instructed by your health care provider.  Only take over-the-counter or prescription medicines as directed by your health care provider.  If a tissue sample (biopsy) was taken during your procedure:  Do not take aspirin or blood thinners for 7 days, or as instructed by your health care provider.  Do not drink alcohol for 7 days, or as instructed by your health care provider.  Eat soft foods for the first 24 hours. SEEK MEDICAL CARE IF: You have persistent spotting of blood in your stool 2-3 days after the procedure. SEEK IMMEDIATE MEDICAL CARE IF:  You have more than a small  spotting of blood in your stool.  You pass large blood clots in your stool.  Your abdomen is swollen (distended).  You have nausea or vomiting.  You have a fever.  You have increasing abdominal pain that is not relieved with medicine. Document Released: 04/07/2004 Document Revised: 06/14/2013 Document Reviewed: 05/01/2013 [facility] Patient Information 2015 [place], [place]. This information is not intended to replace advice given to you by your health care provider. Make sure you discuss any questions you have with your health care provider.  Colon Polyps Polyps are lumps of extra tissue growing inside the body. Polyps can grow in the large intestine (colon). Most colon polyps are noncancerous (benign). However, some colon polyps can become cancerous over time. Polyps that are larger than a pea may be harmful. To be safe, caregivers remove and test all polyps. CAUSES  Polyps form when mutations in the genes cause your cells to grow and divide even though no more tissue is needed. RISK FACTORS There are a number of risk factors that can increase your chances of getting colon polyps. They include:  Being older than 50 years.  Family history of colon polyps or colon cancer.  Long-term colon diseases, such as colitis or Crohn disease.  Being overweight.  Smoking.  Being inactive.  Drinking too much alcohol. SYMPTOMS  Most small polyps do not cause symptoms. If symptoms are present, they may include:  Blood in the stool. The stool may look dark red or black.  Constipation or diarrhea that lasts longer than 1 week. DIAGNOSIS People  often do not know they have polyps until their caregiver finds them during a regular checkup. Your caregiver can use 4 tests to check for polyps:  Digital rectal exam. The caregiver wears gloves and feels inside the rectum. This test would find polyps only in the rectum.  Barium enema. The caregiver puts a liquid called barium into your rectum before  taking X-rays of your colon. Barium makes your colon look white. Polyps are dark, so they are easy to see in the X-ray pictures.  Sigmoidoscopy. A thin, flexible tube (sigmoidoscope) is placed into your rectum. The sigmoidoscope has a light and tiny camera in it. The caregiver uses the sigmoidoscope to look at the last third of your colon.  Colonoscopy. This test is like sigmoidoscopy, but the caregiver looks at the entire colon. This is the most common method for finding and removing polyps. TREATMENT  Any polyps will be removed during a sigmoidoscopy or colonoscopy. The polyps are then tested for cancer. PREVENTION  To help lower your risk of getting more colon polyps:  Eat plenty of fruits and vegetables. Avoid eating fatty foods.  Do not smoke.  Avoid drinking alcohol.  Exercise every day.  Lose weight if recommended by your caregiver.  Eat plenty of calcium and folate. Foods that are rich in calcium include milk, cheese, and broccoli. Foods that are rich in folate include chickpeas, kidney beans, and spinach. HOME CARE INSTRUCTIONS Keep all follow-up appointments as directed by your caregiver. You may need periodic exams to check for polyps. SEEK MEDICAL CARE IF: You notice bleeding during a bowel movement.

## 2014-05-04 ENCOUNTER — Encounter (HOSPITAL_COMMUNITY): Payer: Self-pay | Admitting: Internal Medicine

## 2015-02-08 ENCOUNTER — Emergency Department (HOSPITAL_COMMUNITY): Payer: 59

## 2015-02-08 ENCOUNTER — Emergency Department (HOSPITAL_COMMUNITY)
Admission: EM | Admit: 2015-02-08 | Discharge: 2015-02-08 | Disposition: A | Discharge status: Home / Self Care | Payer: 59 | Attending: Emergency Medicine | Admitting: Emergency Medicine

## 2015-02-08 ENCOUNTER — Encounter (HOSPITAL_COMMUNITY): Payer: Self-pay | Admitting: Emergency Medicine

## 2015-02-08 DIAGNOSIS — R51 Headache: Secondary | ICD-10-CM | POA: Diagnosis not present

## 2015-02-08 DIAGNOSIS — I10 Essential (primary) hypertension: Secondary | ICD-10-CM | POA: Diagnosis not present

## 2015-02-08 DIAGNOSIS — Z79899 Other long term (current) drug therapy: Secondary | ICD-10-CM | POA: Insufficient documentation

## 2015-02-08 DIAGNOSIS — R002 Palpitations: Secondary | ICD-10-CM | POA: Diagnosis present

## 2015-02-08 DIAGNOSIS — Z720 Tobacco use: Secondary | ICD-10-CM | POA: Insufficient documentation

## 2015-02-08 DIAGNOSIS — R519 Headache, unspecified: Secondary | ICD-10-CM

## 2015-02-08 DIAGNOSIS — E119 Type 2 diabetes mellitus without complications: Secondary | ICD-10-CM | POA: Diagnosis not present

## 2015-02-08 DIAGNOSIS — E780 Pure hypercholesterolemia: Secondary | ICD-10-CM | POA: Diagnosis not present

## 2015-02-08 LAB — CBC WITH DIFFERENTIAL/PLATELET
Basophils Absolute: 0 10*3/uL (ref 0.0–0.1)
Basophils Relative: 0 % (ref 0–1)
Eosinophils Absolute: 0.2 10*3/uL (ref 0.0–0.7)
Eosinophils Relative: 3 % (ref 0–5)
HCT: 35.6 % — ABNORMAL LOW (ref 36.0–46.0)
Hemoglobin: 11.5 g/dL — ABNORMAL LOW (ref 12.0–15.0)
Lymphocytes Relative: 50 % — ABNORMAL HIGH (ref 12–46)
Lymphs Abs: 3.5 10*3/uL (ref 0.7–4.0)
MCH: 26.9 pg (ref 26.0–34.0)
MCHC: 32.3 g/dL (ref 30.0–36.0)
MCV: 83.2 fL (ref 78.0–100.0)
Monocytes Absolute: 0.3 10*3/uL (ref 0.1–1.0)
Monocytes Relative: 5 % (ref 3–12)
Neutro Abs: 2.9 10*3/uL (ref 1.7–7.7)
Neutrophils Relative %: 42 % — ABNORMAL LOW (ref 43–77)
Platelets: 220 10*3/uL (ref 150–400)
RBC: 4.28 MIL/uL (ref 3.87–5.11)
RDW: 14.3 % (ref 11.5–15.5)
WBC: 6.9 10*3/uL (ref 4.0–10.5)

## 2015-02-08 LAB — COMPREHENSIVE METABOLIC PANEL
ALT: 15 U/L (ref 14–54)
AST: 18 U/L (ref 15–41)
Albumin: 3.7 g/dL (ref 3.5–5.0)
Alkaline Phosphatase: 69 U/L (ref 38–126)
Anion gap: 7 (ref 5–15)
BUN: 11 mg/dL (ref 6–20)
CO2: 28 mmol/L (ref 22–32)
Calcium: 9.3 mg/dL (ref 8.9–10.3)
Chloride: 108 mmol/L (ref 101–111)
Creatinine, Ser: 0.77 mg/dL (ref 0.44–1.00)
GFR calc Af Amer: >60 mL/min (ref >60)
GFR calc non Af Amer: >60 mL/min (ref >60)
Glucose, Bld: 119 mg/dL — ABNORMAL HIGH (ref 65–99)
Potassium: 3.3 mmol/L — ABNORMAL LOW (ref 3.5–5.1)
Sodium: 143 mmol/L (ref 135–145)
Total Bilirubin: 0.4 mg/dL (ref 0.3–1.2)
Total Protein: 7 g/dL (ref 6.5–8.1)

## 2015-02-08 LAB — TROPONIN I

## 2015-02-08 MED ORDER — SODIUM CHLORIDE 0.9 % IV BOLUS (SEPSIS)
1000.0000 mL | Freq: Once | INTRAVENOUS | Status: AC
  Administered 2015-02-08: 1000 mL via INTRAVENOUS

## 2015-02-08 MED ORDER — DIPHENHYDRAMINE HCL 50 MG/ML IJ SOLN
12.5000 mg | Freq: Once | INTRAMUSCULAR | Status: AC
  Administered 2015-02-08: 12.5 mg via INTRAVENOUS
  Filled 2015-02-08: qty 1

## 2015-02-08 MED ORDER — PROCHLORPERAZINE EDISYLATE 5 MG/ML IJ SOLN
10.0000 mg | Freq: Once | INTRAMUSCULAR | Status: AC
  Administered 2015-02-08: 10 mg via INTRAVENOUS
  Filled 2015-02-08: qty 2

## 2015-02-08 MED ORDER — KETOROLAC TROMETHAMINE 30 MG/ML IJ SOLN
15.0000 mg | Freq: Once | INTRAMUSCULAR | Status: AC
  Administered 2015-02-08: 15 mg via INTRAVENOUS
  Filled 2015-02-08: qty 1

## 2015-02-08 NOTE — ED Notes (Signed)
Pt reports palpitations since last night and headache since this am. nad noted. Pt denies any sob. Pt reports chest pressure.

## 2015-02-16 NOTE — ED Provider Notes (Signed)
CSN: [id number]     Arrival date & time 02/08/15  1310 History   First MD Initiated Contact with Patient 02/08/15 1323     Chief Complaint  Patient presents with  . Palpitations     (Consider location/radiation/quality/duration/timing/severity/associated sxs/prior Treatment) HPI   54 year old female with multiple complaints. Per my complaining of a headache. Gradual onset this morning. Relatively constant since then. Pain is diffuse and achy. No appreciable exacerbating relieving factors. She does complain of palpitations. Intermittent sensation of her heart racing. Some chest pressure when this happens. No shortness of breath. No unusual leg pain or swelling. No recent medication changes.  No acute visual changes. No acute numbness, tingling or focal loss of strength.  Past Medical History  Diagnosis Date  . Hypertension   . Aneurysm 2002    coil / clip  [facility]  . Chronic headaches   . Hypercholesteremia   . Diabetes mellitus without complication    Past Surgical History  Procedure Laterality Date  . Cerebral aneurysm repair  [place]    Right side removal  . Tubal ligation  1990;1992  . Dilation and curettage of uterus    . Appendectomy  1980  . Cervix surgery  1990    Freezing of cervical cells  . Cholecystectomy  01/18/2012    Procedure: LAPAROSCOPIC CHOLECYSTECTOMY;  Surgeon: Donato Heinz, MD;  Location: [facility];  Service: General;  Laterality: N/A;  . Colonoscopy N/A 05/03/2014    Procedure: COLONOSCOPY;  Surgeon: Rogene Houston, MD;  Location: [facility];  Service: Endoscopy;  Laterality: N/A;  68   Family History  Problem Relation Age of Onset  . Hypertension Mother   . Diabetes Mother   . Hyperlipidemia Mother   . Hypertension Sister   . Hypertension Sister   . Colon cancer Neg Hx    History  Substance Use Topics  . Smoking status: Current Every Day Smoker -- 1.00 packs/day for 38 years    Types:  Cigarettes  . Smokeless tobacco: Not on file  . Alcohol Use: No   OB History    No data available     Review of Systems  All systems reviewed and negative, other than as noted in HPI.   Allergies  Pravastatin  Home Medications   Prior to Admission medications   Medication Sig Start Date End Date Taking? Authorizing Provider  atorvastatin (LIPITOR) 20 MG tablet Take 1 tablet (20 mg total) by mouth daily at 6 PM. 02/13/14   Kathyrn Drown, MD  diclofenac (VOLTAREN) 75 MG EC tablet Take 75 mg by mouth 2 (two) times daily as needed. pain    Historical Provider, MD  ibuprofen (ADVIL,MOTRIN) 200 MG tablet Take 200 mg by mouth every 8 (eight) hours as needed. pain    Historical Provider, MD  lisinopril-hydrochlorothiazide (PRINZIDE,ZESTORETIC) 20-12.5 MG per tablet Take 1 tablet by mouth daily. 06/14/13   Kathyrn Drown, MD  metFORMIN (GLUCOPHAGE) 500 MG tablet Take 250 mg by mouth 2 (two) times daily with a meal.    Historical Provider, MD   BP 136/69 mmHg  Pulse 69  Temp(Src) 98.1 F (36.7 C) (Oral)  Resp 16  Ht 5\' 7"  (1.702 m)  Wt 206 lb (93.441 kg)  BMI 32.26 kg/m2  SpO2 100%  LMP 01/15/2012 Physical Exam  Constitutional: She is oriented to person, place, and time. She appears well-developed and well-nourished. No distress.  HENT:  Head: Normocephalic  and atraumatic.  Eyes: Conjunctivae and EOM are normal. Pupils are equal, round, and reactive to light. Right eye exhibits no discharge. Left eye exhibits no discharge.  Neck: Neck supple.  No nuchal rigidity  Cardiovascular: Normal rate, regular rhythm and normal heart sounds.  Exam reveals no gallop and no friction rub.   No murmur heard. Pulmonary/Chest: Effort normal and breath sounds normal. No respiratory distress.  Abdominal: Soft. She exhibits no distension. There is no tenderness.  Musculoskeletal: She exhibits no edema or tenderness.  Lymphadenopathy:    She has no cervical adenopathy.  Neurological: She is alert  and oriented to person, place, and time. No cranial nerve deficit. She exhibits normal muscle tone. Coordination normal.  Speech clear. Content appropriate. Follows commands. Compared totesting bilaterally. No focal motor deficit. Cranial nerves II through XII are intact.  Skin: Skin is warm and dry.  Psychiatric: She has a normal mood and affect. Her behavior is normal. Thought content normal.  Nursing note and vitals reviewed.   ED Course  Procedures (including critical care time) Labs Review Labs Reviewed  CBC WITH DIFFERENTIAL/PLATELET - Abnormal; Notable for the following:    Hemoglobin 11.5 (*)    HCT 35.6 (*)    Neutrophils Relative % 42 (*)    Lymphocytes Relative 50 (*)    All other components within normal limits  COMPREHENSIVE METABOLIC PANEL - Abnormal; Notable for the following:    Potassium 3.3 (*)    Glucose, Bld 119 (*)    All other components within normal limits  TROPONIN I    Imaging Review No results found.   EKG Interpretation   Date/Time:  Friday February 08 2015 13:14:48 EDT Ventricular Rate:  83 PR Interval:  152 QRS Duration: 86 QT Interval:  366 QTC Calculation: 430 R Axis:     Text Interpretation:  Normal sinus rhythm voltage criteria for LVH in aVL  Nonspecific T wave abnormality Abnormal ECG Confirmed by Wilson Singer  MD,  Chaeli Judy ([id number]) on 02/08/2015 1:32:37 PM      MDM   Final diagnoses:  Palpitations  Nonintractable headache, unspecified chronicity pattern, unspecified headache type    53y F with headache. Suspect primary HA. Consider emergent secondary causes such as bleed, infectious or mass but doubt. There is no history of trauma. Pt has a nonfocal neurological exam. Afebrile and neck supple. No use of blood thinning medication. Consider ocular etiology such as acute angle closure glaucoma but doubt. Pt denies acute change in visual acuity and eye exam unremarkable. Doubt temporal arteritis given age, no temporal tenderness and temporal artery  pulsations palpable. Doubt CO poisoning. No contacts with similar symptoms. Doubt venous thrombosis. Doubt carotid or vertebral arteries dissection. Symptoms improved with meds. Unclear etiology of her palpitation. Her EKG is fairly unremarkable. Lites are fine. Suspicion for emergent process. Feel that can be safely discharged, but strict return precautions discussed. Outpt fu.     Virgel Manifold, MD 02/16/15 2240

## 2015-11-24 ENCOUNTER — Emergency Department (HOSPITAL_COMMUNITY)
Admission: EM | Admit: 2015-11-24 | Discharge: 2015-11-24 | Disposition: A | Discharge status: Home / Self Care | Payer: BLUE CROSS/BLUE SHIELD | Attending: Emergency Medicine | Admitting: Emergency Medicine

## 2015-11-24 ENCOUNTER — Encounter (HOSPITAL_COMMUNITY): Payer: Self-pay | Admitting: *Deleted

## 2015-11-24 DIAGNOSIS — R1084 Generalized abdominal pain: Secondary | ICD-10-CM

## 2015-11-24 DIAGNOSIS — E119 Type 2 diabetes mellitus without complications: Secondary | ICD-10-CM | POA: Insufficient documentation

## 2015-11-24 DIAGNOSIS — I10 Essential (primary) hypertension: Secondary | ICD-10-CM | POA: Diagnosis not present

## 2015-11-24 DIAGNOSIS — F17210 Nicotine dependence, cigarettes, uncomplicated: Secondary | ICD-10-CM | POA: Diagnosis not present

## 2015-11-24 DIAGNOSIS — R197 Diarrhea, unspecified: Secondary | ICD-10-CM | POA: Diagnosis not present

## 2015-11-24 LAB — CBC
HCT: 41.7 % (ref 36.0–46.0)
Hemoglobin: 13.4 g/dL (ref 12.0–15.0)
MCH: 26.5 pg (ref 26.0–34.0)
MCHC: 32.1 g/dL (ref 30.0–36.0)
MCV: 82.6 fL (ref 78.0–100.0)
Platelets: 259 10*3/uL (ref 150–400)
RBC: 5.05 MIL/uL (ref 3.87–5.11)
RDW: 13.9 % (ref 11.5–15.5)
WBC: 9.1 10*3/uL (ref 4.0–10.5)

## 2015-11-24 LAB — URINALYSIS, ROUTINE W REFLEX MICROSCOPIC
Bilirubin Urine: NEGATIVE
Glucose, UA: NEGATIVE mg/dL
Hgb urine dipstick: NEGATIVE
Ketones, ur: NEGATIVE mg/dL
Leukocytes, UA: NEGATIVE
Nitrite: NEGATIVE
Protein, ur: NEGATIVE mg/dL
Specific Gravity, Urine: 1.005 (ref 1.005–1.030)
pH: 6 (ref 5.0–8.0)

## 2015-11-24 LAB — COMPREHENSIVE METABOLIC PANEL
ALT: 13 U/L — ABNORMAL LOW (ref 14–54)
AST: 18 U/L (ref 15–41)
Albumin: 4 g/dL (ref 3.5–5.0)
Alkaline Phosphatase: 79 U/L (ref 38–126)
Anion gap: 7 (ref 5–15)
BUN: 14 mg/dL (ref 6–20)
CO2: 28 mmol/L (ref 22–32)
Calcium: 9.4 mg/dL (ref 8.9–10.3)
Chloride: 108 mmol/L (ref 101–111)
Creatinine, Ser: 0.82 mg/dL (ref 0.44–1.00)
GFR calc Af Amer: >60 mL/min (ref >60)
GFR calc non Af Amer: >60 mL/min (ref >60)
Glucose, Bld: 133 mg/dL — ABNORMAL HIGH (ref 65–99)
Potassium: 3.9 mmol/L (ref 3.5–5.1)
Sodium: 143 mmol/L (ref 135–145)
Total Bilirubin: 0.4 mg/dL (ref 0.3–1.2)
Total Protein: 7.7 g/dL (ref 6.5–8.1)

## 2015-11-24 LAB — LIPASE, BLOOD: Lipase: 36 U/L (ref 11–51)

## 2015-11-24 MED ORDER — DICYCLOMINE HCL 20 MG PO TABS: 20.0000 mg | ORAL_TABLET | Freq: Four times a day (QID) | ORAL | Status: DC | PRN

## 2015-11-24 MED ORDER — ONDANSETRON 4 MG PO TBDP
4.0000 mg | ORAL_TABLET | Freq: Once | ORAL | Status: AC
  Administered 2015-11-24: 4 mg via ORAL
  Filled 2015-11-24: qty 1

## 2015-11-24 MED ORDER — LISINOPRIL-HYDROCHLOROTHIAZIDE 20-12.5 MG PO TABS: 1.0000 | ORAL_TABLET | Freq: Every day | ORAL | Status: DC

## 2015-11-24 MED ORDER — LOPERAMIDE HCL 2 MG PO CAPS
4.0000 mg | ORAL_CAPSULE | Freq: Once | ORAL | Status: AC
  Administered 2015-11-24: 4 mg via ORAL
  Filled 2015-11-24: qty 2

## 2015-11-24 NOTE — ED Provider Notes (Signed)
CSN: [id number]     Arrival date & time 11/24/15  1712 History  By signing my name below, I, [facility], attest that this documentation has been prepared under the direction and in the presence of Virgel Manifold, MD. Electronically Signed: Virgel Bouquet, ED Scribe. 11/24/2015. 8:32 PM.    Chief Complaint  Patient presents with  . Abdominal Pain   The history is provided by the patient. No language interpreter was used.   HPI Comments: AMBREEN VUU is a 55 y.o. female with an PMHx of HTN, hypercholesteremia, and DM who presents to the Emergency Department complaining of Intermittent, stabbing epigastric abdominal pain onset last night. Patient reports that the pain last between a few seconds up to a minute at a time before resolving without treatment. She endorses associated diarrhea. Pain has no alleviating or exacerabting factors. She has not taken any medications or tried any treatments. Denies recent sick contact. Denies vomiting, fever, difficutly urinating, and hematuria. NKDA  Past Medical History  Diagnosis Date  . Hypertension   . Aneurysm ([place]) 2002    coil / clip  [facility]  . Chronic headaches   . Hypercholesteremia   . Diabetes mellitus without complication [facility])    Past Surgical History  Procedure Laterality Date  . Cerebral aneurysm repair  [place]    Right side removal  . Tubal ligation  1990;1992  . Dilation and curettage of uterus    . Appendectomy  1980  . Cervix surgery  1990    Freezing of cervical cells  . Cholecystectomy  01/18/2012    Procedure: LAPAROSCOPIC CHOLECYSTECTOMY;  Surgeon: Donato Heinz, MD;  Location: [facility];  Service: General;  Laterality: N/A;  . Colonoscopy N/A 05/03/2014    Procedure: COLONOSCOPY;  Surgeon: Rogene Houston, MD;  Location: [facility];  Service: Endoscopy;  Laterality: N/A;  64   Family History  Problem Relation Age of Onset  . Hypertension Mother    . Diabetes Mother   . Hyperlipidemia Mother   . Hypertension Sister   . Hypertension Sister   . Colon cancer Neg Hx    Social History  Substance Use Topics  . Smoking status: Current Every Day Smoker -- 1.00 packs/day for 38 years    Types: Cigarettes  . Smokeless tobacco: None  . Alcohol Use: No   OB History    No data available     Review of Systems  Constitutional: Negative for fever.  Gastrointestinal: Positive for abdominal pain and diarrhea. Negative for nausea and vomiting.  Genitourinary: Negative for hematuria.  All other systems reviewed and are negative.     Allergies  Pravastatin  Home Medications   Prior to Admission medications   Medication Sig Start Date End Date Taking? Authorizing Provider  atorvastatin (LIPITOR) 20 MG tablet Take 1 tablet (20 mg total) by mouth daily at 6 PM. 02/13/14   Kathyrn Drown, MD  diclofenac (VOLTAREN) 75 MG EC tablet Take 75 mg by mouth 2 (two) times daily as needed. pain    Historical Provider, MD  ibuprofen (ADVIL,MOTRIN) 200 MG tablet Take 200 mg by mouth every 8 (eight) hours as needed. pain    Historical Provider, MD  lisinopril-hydrochlorothiazide (PRINZIDE,ZESTORETIC) 20-12.5 MG per tablet Take 1 tablet by mouth daily. 06/14/13   Kathyrn Drown, MD  metFORMIN (GLUCOPHAGE) 500 MG tablet Take 250 mg by mouth 2 (two) times daily with a meal.  Historical Provider, MD   BP 153/102 mmHg  Pulse 85  Temp(Src) 98.2 F (36.8 C) (Oral)  Resp 17  Ht 5\' 7"  (1.702 m)  Wt 215 lb (97.523 kg)  BMI 33.67 kg/m2  SpO2 100%  LMP 01/15/2012 Physical Exam  Constitutional: She is oriented to person, place, and time. She appears well-developed and well-nourished. No distress.  HENT:  Head: Normocephalic and atraumatic.  Eyes: Conjunctivae and EOM are normal.  Neck: Neck supple. No tracheal deviation present.  Cardiovascular: Normal rate.   Pulmonary/Chest: Effort normal. No respiratory distress.  Abdominal: She exhibits no  distension. There is tenderness. There is no rebound and no guarding.  Mild epigastric tenderness. No rebounding, guarding, distension.  Musculoskeletal: Normal range of motion.  Neurological: She is alert and oriented to person, place, and time.  Skin: Skin is warm and dry.  Psychiatric: She has a normal mood and affect. Her behavior is normal.  Nursing note and vitals reviewed.   ED Course  Procedures   DIAGNOSTIC STUDIES: Oxygen Saturation is 100% on RA, normal by my interpretation.    COORDINATION OF CARE: 8:16 PM Will order pain medication. Discussed treatment plan with pt at bedside and pt agreed to plan.   Labs Review Labs Reviewed  COMPREHENSIVE METABOLIC PANEL - Abnormal; Notable for the following:    Glucose, Bld 133 (*)    ALT 13 (*)    All other components within normal limits  LIPASE, BLOOD  CBC  URINALYSIS, ROUTINE W REFLEX MICROSCOPIC (NOT AT [facility])    Imaging Review No results found. I have personally reviewed and evaluated these images and lab results as part of my medical decision-making.   EKG Interpretation None      MDM   Final diagnoses:  Generalized abdominal pain  Diarrhea, unspecified type  55 year old female with crampy abdominal pain. Her abdominal exam was actually fairly benign. Minimal tenderness in epigastrium/left upper quadrant. Certainly no peritoneal signs. She is not distended. I very low suspicion for emergent surgical process. Plan symptomatic treatment at this time. She was additionally given prescription for her home blood pressure medication which she is currently out of. Return precautions were discussed. Outpatient follow-up otherwise.  I personally preformed the services scribed in my presence. The recorded information has been reviewed is accurate. Virgel Manifold, MD.    Virgel Manifold, MD 11/28/15 2245

## 2015-11-24 NOTE — ED Notes (Signed)
Pt comes in for abdominal pain starting last night. Pt states she previously had the same type of pain before that dissipated on it's own. Pt denies any vomiting, diarrhea is present.

## 2015-11-24 NOTE — Discharge Instructions (Signed)
Abdominal Pain, Adult °Many things can cause abdominal pain. Usually, abdominal pain is not caused by a disease and will improve without treatment. It can often be observed and treated at home. Your health care provider will do a physical exam and possibly order blood tests and X-rays to help determine the seriousness of your pain. However, in many cases, more time must pass before a clear cause of the pain can be found. Before that point, your health care provider may not know if you need more testing or further treatment. °HOME CARE INSTRUCTIONS °Monitor your abdominal pain for any changes. The following actions may help to alleviate any discomfort you are experiencing: °· Only take over-the-counter or prescription medicines as directed by your health care provider. °· Do not take laxatives unless directed to do so by your health care provider. °· Try a clear liquid diet (broth, tea, or water) as directed by your health care provider. Slowly move to a bland diet as tolerated. °SEEK MEDICAL CARE IF: °· You have unexplained abdominal pain. °· You have abdominal pain associated with nausea or diarrhea. °· You have pain when you urinate or have a bowel movement. °· You experience abdominal pain that wakes you in the night. °· You have abdominal pain that is worsened or improved by eating food. °· You have abdominal pain that is worsened with eating fatty foods. °· You have a fever. °SEEK IMMEDIATE MEDICAL CARE IF: °· Your pain does not go away within 2 hours. °· You keep throwing up (vomiting). °· Your pain is felt only in portions of the abdomen, such as the right side or the left lower portion of the abdomen. °· You pass bloody or black tarry stools. °MAKE SURE YOU: °· Understand these instructions. °· Will watch your condition. °· Will get help right away if you are not doing well or get worse. °  °This information is not intended to replace advice given to you by your health care provider. Make sure you discuss  any questions you have with your health care provider. °  °Document Released: 06/03/2005 Document Revised: 05/15/2015 Document Reviewed: 05/03/2013 °Elsevier Interactive Patient Education ©2016 [employer] ° °Diarrhea °Diarrhea is frequent loose and watery bowel movements. It can cause you to feel weak and dehydrated. Dehydration can cause you to become tired and thirsty, have a dry mouth, and have decreased urination that often is dark yellow. Diarrhea is a sign of another problem, most often an infection that will not last long. In most cases, diarrhea typically lasts 2-3 days. However, it can last longer if it is a sign of something more serious. It is important to treat your diarrhea as directed by your caregiver to lessen or prevent future episodes of diarrhea. °CAUSES  °Some common causes include: °· Gastrointestinal infections caused by viruses, bacteria, or parasites. °· Food poisoning or food allergies. °· Certain medicines, such as antibiotics, chemotherapy, and laxatives. °· Artificial sweeteners and fructose. °· Digestive disorders. °HOME CARE INSTRUCTIONS °· Ensure adequate fluid intake (hydration): Have 1 cup (8 oz) of fluid for each diarrhea episode. Avoid fluids that contain simple sugars or sports drinks, fruit juices, whole milk products, and sodas. Your urine should be clear or pale yellow if you are drinking enough fluids. Hydrate with an oral rehydration solution that you can purchase at pharmacies, retail stores, and online. You can prepare an oral rehydration solution at home by mixing the following ingredients together: °¨  - tsp table salt. °¨ ¾ tsp baking soda. °¨    tsp salt substitute containing potassium chloride. °¨ 1  tablespoons sugar. °¨ 1 L (34 oz) of water. °· Certain foods and beverages may increase the speed at which food moves through the gastrointestinal (GI) tract. These foods and beverages should be avoided and include: °¨ Caffeinated and alcoholic beverages. °¨ High-fiber  foods, such as raw fruits and vegetables, nuts, seeds, and whole grain breads and cereals. °¨ Foods and beverages sweetened with sugar alcohols, such as xylitol, sorbitol, and mannitol. °· Some foods may be well tolerated and may help thicken stool including: °¨ Starchy foods, such as rice, toast, pasta, low-sugar cereal, oatmeal, grits, baked potatoes, crackers, and bagels. °¨ Bananas. °¨ Applesauce. °· Add probiotic-rich foods to help increase healthy bacteria in the GI tract, such as yogurt and fermented milk products. °· Wash your hands well after each diarrhea episode. °· Only take over-the-counter or prescription medicines as directed by your caregiver. °· Take a warm bath to relieve any burning or pain from frequent diarrhea episodes. °SEEK IMMEDIATE MEDICAL CARE IF:  °· You are unable to keep fluids down. °· You have persistent vomiting. °· You have blood in your stool, or your stools are black and tarry. °· You do not urinate in 6-8 hours, or there is only a small amount of very dark urine. °· You have abdominal pain that increases or localizes. °· You have weakness, dizziness, confusion, or light-headedness. °· You have a severe headache. °· Your diarrhea gets worse or does not get better. °· You have a fever or persistent symptoms for more than 2-3 days. °· You have a fever and your symptoms suddenly get worse. °MAKE SURE YOU:  °· Understand these instructions. °· Will watch your condition. °· Will get help right away if you are not doing well or get worse. °  °This information is not intended to replace advice given to you by your health care provider. Make sure you discuss any questions you have with your health care provider. °  °Document Released: 08/14/2002 Document Revised: 09/14/2014 Document Reviewed: 05/01/2012 °Elsevier Interactive Patient Education ©2016 [employer] Inc. ° °

## 2015-12-04 ENCOUNTER — Encounter (INDEPENDENT_AMBULATORY_CARE_PROVIDER_SITE_OTHER): Payer: Self-pay | Admitting: Ophthalmology

## 2015-12-27 ENCOUNTER — Encounter (INDEPENDENT_AMBULATORY_CARE_PROVIDER_SITE_OTHER): Payer: Self-pay | Admitting: Ophthalmology

## 2015-12-30 ENCOUNTER — Encounter (INDEPENDENT_AMBULATORY_CARE_PROVIDER_SITE_OTHER): Payer: Self-pay | Admitting: Ophthalmology

## 2016-03-23 ENCOUNTER — Other Ambulatory Visit: Payer: Self-pay | Admitting: Family Medicine

## 2016-03-23 ENCOUNTER — Ambulatory Visit (INDEPENDENT_AMBULATORY_CARE_PROVIDER_SITE_OTHER): Payer: BLUE CROSS/BLUE SHIELD | Admitting: Family Medicine

## 2016-03-23 ENCOUNTER — Ambulatory Visit (HOSPITAL_COMMUNITY)
Admission: RE | Admit: 2016-03-23 | Discharge: 2016-03-23 | Disposition: A | Discharge status: Home / Self Care | Payer: BLUE CROSS/BLUE SHIELD | Source: Ambulatory Visit | Attending: Family Medicine | Admitting: Family Medicine

## 2016-03-23 ENCOUNTER — Encounter: Payer: Self-pay | Admitting: Family Medicine

## 2016-03-23 VITALS — BP 126/80 | Ht 67.0 in | Wt 211.1 lb

## 2016-03-23 DIAGNOSIS — E785 Hyperlipidemia, unspecified: Secondary | ICD-10-CM | POA: Diagnosis not present

## 2016-03-23 DIAGNOSIS — R05 Cough: Secondary | ICD-10-CM

## 2016-03-23 DIAGNOSIS — R059 Cough, unspecified: Secondary | ICD-10-CM

## 2016-03-23 DIAGNOSIS — E119 Type 2 diabetes mellitus without complications: Secondary | ICD-10-CM

## 2016-03-23 DIAGNOSIS — Z01419 Encounter for gynecological examination (general) (routine) without abnormal findings: Secondary | ICD-10-CM | POA: Diagnosis not present

## 2016-03-23 DIAGNOSIS — I10 Essential (primary) hypertension: Secondary | ICD-10-CM | POA: Diagnosis not present

## 2016-03-23 DIAGNOSIS — I517 Cardiomegaly: Secondary | ICD-10-CM | POA: Insufficient documentation

## 2016-03-23 DIAGNOSIS — Z1231 Encounter for screening mammogram for malignant neoplasm of breast: Secondary | ICD-10-CM

## 2016-03-23 MED ORDER — VARENICLINE TARTRATE 0.5 MG X 11 & 1 MG X 42 PO MISC: ORAL | Status: DC

## 2016-03-23 MED ORDER — VARENICLINE TARTRATE 1 MG PO TABS: 1.0000 mg | ORAL_TABLET | Freq: Two times a day (BID) | ORAL | Status: DC

## 2016-03-23 NOTE — Progress Notes (Signed)
   Subjective:    Patient ID: Denise Hill, female    DOB: December 06, 1960, 55 y.o.   MRN: [id number]  HPI The patient comes in today for a wellness visit.    A review of their health history was completed.  A review of medications was also completed.  Any needed refills; yes  Eating habits: Patient states trying to improve eating habits. Trying to incorporate healthier choices.  Falls/  MVA accidents in past few months: None  Regular exercise: Patient states walks at least 4 days per week.  Specialist pt sees on regular basis: None  Preventative health issues were discussed.   Additional concerns: Patient has concerns of headaches, pain and tingling to bilateral feet and legs, and hot flashes. Patient has history prediabetes hyperlipidemia has not taking medication over year Patient does smoke she is interested in quitting smoking we talked at length about Chantix she is requested this medication will be sent  Patient get some headaches that occur during the day sometimes mild sometimes bad enough to cause severe 8 do not cause nausea vomiting or diarrhea they do not wake her up in the middle the night Review of Systems Denies chest tightness pressure pain shortness breath does relate occasional increased urinary frequency denies vaginal bleeding states she only gets maybe one cycle every 6-9 months    Objective:   Physical Exam Ears nose throat normal neck no masses lungs clear no crackles breast exam normal bilateral no masses abdomen soft pelvic exam cervix normal Pap smear taken no masses felt extremities no edema skin warm dry       Assessment & Plan:   Intermittent headaches keep track of the headaches and numbness and headache calendar in the coming weeks none of the headaches seem life-threatening currently  Probable early neuropathy of the feet recommend checking lab work may need to be back on medication for cholesterol and diabetes  Patient with a cough in the  morning hours and brings up clear phlegm she does smoke we will get a chest x-ray no hemoptysis  Adult wellness-complete.wellness physical was conducted today. Importance of diet and exercise were discussed in detail. In addition to this a discussion regarding safety was also covered. We also reviewed over immunizations and gave recommendations regarding current immunization needed for age. In addition to this additional areas were also touched on including: Preventative health exams needed: Colonoscopy patient up-to-date on colonoscopy  Patient was advised yearly wellness exam Patient will be setting up her own mammogram information given Lab work ordered. Await the results of this. More than likely will need to be started back on medications for blood pressure and cholesterol. Chantix started for quitting smoking purposes

## 2016-03-24 ENCOUNTER — Other Ambulatory Visit: Payer: Self-pay

## 2016-03-24 DIAGNOSIS — Z79899 Other long term (current) drug therapy: Secondary | ICD-10-CM

## 2016-03-24 LAB — HEPATIC FUNCTION PANEL
ALT: 13 IU/L (ref 0–32)
AST: 16 IU/L (ref 0–40)
Albumin: 4.2 g/dL (ref 3.5–5.5)
Alkaline Phosphatase: 84 IU/L (ref 39–117)
Bilirubin Total: 0.3 mg/dL (ref 0.0–1.2)
Bilirubin, Direct: 0.06 mg/dL (ref 0.00–0.40)
Total Protein: 7.4 g/dL (ref 6.0–8.5)

## 2016-03-24 LAB — MICROALBUMIN / CREATININE URINE RATIO
Creatinine, Urine: 261.3 mg/dL
MICROALB/CREAT RATIO: 3.8 mg/g creat (ref 0.0–30.0)
Microalbumin, Urine: 9.9 ug/mL

## 2016-03-24 LAB — BASIC METABOLIC PANEL
BUN/Creatinine Ratio: 15 (ref 9–23)
BUN: 11 mg/dL (ref 6–24)
CO2: 24 mmol/L (ref 18–29)
Calcium: 9.4 mg/dL (ref 8.7–10.2)
Chloride: 103 mmol/L (ref 96–106)
Creatinine, Ser: 0.73 mg/dL (ref 0.57–1.00)
GFR calc Af Amer: 109 mL/min/{1.73_m2} (ref >59)
GFR calc non Af Amer: 94 mL/min/{1.73_m2} (ref >59)
Glucose: 114 mg/dL — ABNORMAL HIGH (ref 65–99)
Potassium: 4.2 mmol/L (ref 3.5–5.2)
Sodium: 143 mmol/L (ref 134–144)

## 2016-03-24 LAB — LIPID PANEL
Chol/HDL Ratio: 6 ratio units — ABNORMAL HIGH (ref 0.0–4.4)
Cholesterol, Total: 275 mg/dL — ABNORMAL HIGH (ref 100–199)
HDL: 46 mg/dL (ref >39)
LDL Calculated: 191 mg/dL — ABNORMAL HIGH (ref 0–99)
Triglycerides: 189 mg/dL — ABNORMAL HIGH (ref 0–149)
VLDL Cholesterol Cal: 38 mg/dL (ref 5–40)

## 2016-03-24 LAB — HEMOGLOBIN A1C
Est. average glucose Bld gHb Est-mCnc: 146 mg/dL
Hgb A1c MFr Bld: 6.7 % — ABNORMAL HIGH (ref 4.8–5.6)

## 2016-03-24 MED ORDER — ATORVASTATIN CALCIUM 20 MG PO TABS: 20.0000 mg | ORAL_TABLET | Freq: Every day | ORAL | Status: DC

## 2016-03-24 MED ORDER — LISINOPRIL 5 MG PO TABS: 5.0000 mg | ORAL_TABLET | Freq: Every day | ORAL | Status: DC

## 2016-03-24 MED ORDER — METFORMIN HCL 500 MG PO TABS: 500.0000 mg | ORAL_TABLET | Freq: Two times a day (BID) | ORAL | Status: DC

## 2016-03-25 LAB — PAP IG W/ RFLX HPV ASCU: PAP Smear Comment: 0

## 2016-04-05 ENCOUNTER — Emergency Department (HOSPITAL_COMMUNITY)
Admission: EM | Admit: 2016-04-05 | Discharge: 2016-04-05 | Disposition: A | Discharge status: Home / Self Care | Payer: BLUE CROSS/BLUE SHIELD | Attending: Emergency Medicine | Admitting: Emergency Medicine

## 2016-04-05 ENCOUNTER — Encounter (HOSPITAL_COMMUNITY): Payer: Self-pay | Admitting: Emergency Medicine

## 2016-04-05 ENCOUNTER — Emergency Department (HOSPITAL_COMMUNITY): Payer: BLUE CROSS/BLUE SHIELD

## 2016-04-05 DIAGNOSIS — I10 Essential (primary) hypertension: Secondary | ICD-10-CM | POA: Diagnosis not present

## 2016-04-05 DIAGNOSIS — Z791 Long term (current) use of non-steroidal anti-inflammatories (NSAID): Secondary | ICD-10-CM | POA: Diagnosis not present

## 2016-04-05 DIAGNOSIS — E119 Type 2 diabetes mellitus without complications: Secondary | ICD-10-CM | POA: Diagnosis not present

## 2016-04-05 DIAGNOSIS — Z79899 Other long term (current) drug therapy: Secondary | ICD-10-CM | POA: Insufficient documentation

## 2016-04-05 DIAGNOSIS — R079 Chest pain, unspecified: Secondary | ICD-10-CM

## 2016-04-05 DIAGNOSIS — R0789 Other chest pain: Secondary | ICD-10-CM | POA: Insufficient documentation

## 2016-04-05 DIAGNOSIS — Z7984 Long term (current) use of oral hypoglycemic drugs: Secondary | ICD-10-CM | POA: Insufficient documentation

## 2016-04-05 LAB — TROPONIN I
Troponin I: <0.03 ng/mL (ref <0.03)
Troponin I: <0.03 ng/mL (ref <0.03)

## 2016-04-05 LAB — CBC
HCT: 37.1 % (ref 36.0–46.0)
Hemoglobin: 12.4 g/dL (ref 12.0–15.0)
MCH: 27.2 pg (ref 26.0–34.0)
MCHC: 33.4 g/dL (ref 30.0–36.0)
MCV: 81.4 fL (ref 78.0–100.0)
Platelets: 234 10*3/uL (ref 150–400)
RBC: 4.56 MIL/uL (ref 3.87–5.11)
RDW: 14.1 % (ref 11.5–15.5)
WBC: 7.1 10*3/uL (ref 4.0–10.5)

## 2016-04-05 LAB — COMPREHENSIVE METABOLIC PANEL
ALT: 16 U/L (ref 14–54)
AST: 21 U/L (ref 15–41)
Albumin: 4 g/dL (ref 3.5–5.0)
Alkaline Phosphatase: 69 U/L (ref 38–126)
Anion gap: 7 (ref 5–15)
BUN: 10 mg/dL (ref 6–20)
CO2: 24 mmol/L (ref 22–32)
Calcium: 8.9 mg/dL (ref 8.9–10.3)
Chloride: 108 mmol/L (ref 101–111)
Creatinine, Ser: 0.62 mg/dL (ref 0.44–1.00)
GFR calc Af Amer: >60 mL/min (ref >60)
GFR calc non Af Amer: >60 mL/min (ref >60)
Glucose, Bld: 115 mg/dL — ABNORMAL HIGH (ref 65–99)
Potassium: 3.7 mmol/L (ref 3.5–5.1)
Sodium: 139 mmol/L (ref 135–145)
Total Bilirubin: 0.2 mg/dL — ABNORMAL LOW (ref 0.3–1.2)
Total Protein: 7.4 g/dL (ref 6.5–8.1)

## 2016-04-05 MED ORDER — NITROGLYCERIN 0.4 MG SL SUBL
0.4000 mg | SUBLINGUAL_TABLET | SUBLINGUAL | Status: DC | PRN
  Administered 2016-04-05 (×2): 0.4 mg via SUBLINGUAL

## 2016-04-05 MED ORDER — ACETAMINOPHEN 325 MG PO TABS
ORAL_TABLET | ORAL | Status: AC
  Filled 2016-04-05: qty 2

## 2016-04-05 MED ORDER — ACETAMINOPHEN 325 MG PO TABS
650.0000 mg | ORAL_TABLET | Freq: Once | ORAL | Status: AC
  Administered 2016-04-05: 650 mg via ORAL

## 2016-04-05 MED ORDER — ASPIRIN 81 MG PO CHEW
324.0000 mg | CHEWABLE_TABLET | Freq: Once | ORAL | Status: AC
  Administered 2016-04-05: 324 mg via ORAL
  Filled 2016-04-05: qty 4

## 2016-04-05 NOTE — ED Provider Notes (Signed)
[facility] DEPT Provider Note   CSN: [id number] Arrival date & time: 04/05/16  [id number]  First Provider Contact:  First MD Initiated Contact with Patient 04/05/16 [id number]        History   Chief Complaint Chief Complaint  Patient presents with  . Chest Pain    HPI Denise Hill is a 55 y.o. female.  TAHNYA Hill is a 55 y.o. Female with past medical history significant for diabetes, hypertension and hypercholesterolemia presenting with a 12 hour history of left-sided chest pressure.  She stated last evening prior to going to bed she noted this symptom and felt it was probably heartburn, but it persisted this morning and is now described more as a pressure then a burning sensation.  Her symptoms are triggered by exertion. She denies shortness of breath, diaphoresis, dizziness, nausea or emesis.  She does endorse a mild frontal headache.  She has had no medications prior to arrival for this symptom.  She denies personal or family history of significant cardiac disease, she has been told in the past she has a "enlarged heart".  She also has a history of cigarette use but quit 3 days ago, currently using chantix.     The history is provided by the patient.    Past Medical History:  Diagnosis Date  . Aneurysm ([place]) 2002   coil / clip  [facility]  . Chronic headaches   . Diabetes mellitus without complication ([place])   . Hypercholesteremia   . Hypertension     Patient Active Problem List   Diagnosis Date Noted  . Type 2 diabetes mellitus ([place]) 07/20/2013  . Hyperlipidemia 07/20/2013  . Osteoarthritis of both knees 07/20/2013  . Essential hypertension, benign 06/14/2013    Past Surgical History:  Procedure Laterality Date  . APPENDECTOMY  1980  . CEREBRAL ANEURYSM REPAIR  2002   [place]   Freezing of cervical cells  . CHOLECYSTECTOMY  01/18/2012   Procedure: LAPAROSCOPIC CHOLECYSTECTOMY;  Surgeon: Donato Heinz, MD;  Location: [facility]  ORS;  Service: General;  Laterality: N/A;  . COLONOSCOPY N/A 05/03/2014   Procedure: COLONOSCOPY;  Surgeon: Rogene Houston, MD;  Location: [facility] ENDO SUITE;  Service: Endoscopy;  Laterality: N/A;  830  . DILATION AND CURETTAGE OF UTERUS    . SALPINGOOPHORECTOMY  1980   Right side removal  . TUBAL LIGATION  1990;1992    OB History    No data available       Home Medications    Prior to Admission medications   Medication Sig Start Date End Date Taking? Authorizing Provider  atorvastatin (LIPITOR) 20 MG tablet Take 1 tablet (20 mg total) by mouth daily. 03/24/16  Yes Kathyrn Drown, MD  BLACK COHOSH EXTRACT PO Take 1 tablet by mouth daily.   Yes Historical Provider, MD  Cholecalciferol (VITAMIN D PO) Take 1 tablet by mouth daily.   Yes Historical Provider, MD  Cyanocobalamin (VITAMIN B-12 PO) Take 1 tablet by mouth daily.   Yes Historical Provider, MD  ibuprofen (ADVIL,MOTRIN) 200 MG tablet Take 800 mg by mouth every 8 (eight) hours as needed for moderate pain. pain    Yes Historical Provider, MD  lisinopril (PRINIVIL,ZESTRIL) 5 MG tablet Take 1 tablet (5 mg total) by mouth daily. 03/24/16  Yes Kathyrn Drown, MD  metFORMIN (GLUCOPHAGE) 500 MG tablet Take 1 tablet (500 mg total) by mouth 2 (two) times daily with a meal. 03/24/16  Yes Scott A  Wolfgang Phoenix, MD  varenicline (CHANTIX STARTING MONTH PAK) 0.5 MG X 11 & 1 MG X 42 tablet Take one 0.5 mg tablet by mouth once daily for 3 days, then increase to one 0.5 mg tablet twice daily for 4 days, then increase to one 1 mg tablet twice daily. 03/23/16  Yes Kathyrn Drown, MD  varenicline (CHANTIX CONTINUING MONTH PAK) 1 MG tablet Take 1 tablet (1 mg total) by mouth 2 (two) times daily. 03/23/16   Kathyrn Drown, MD    Family History Family History  Problem Relation Age of Onset  . Hypertension Mother   . Diabetes Mother   . Hyperlipidemia Mother   . Hypertension Sister   . Hypertension Sister   . Colon cancer Neg Hx     Social History Social  History  Substance Use Topics  . Smoking status: Current Every Day Smoker    Packs/day: 1.00    Years: 38.00    Types: Cigarettes  . Smokeless tobacco: Never Used  . Alcohol use No     Allergies   Pravastatin   Review of Systems Review of Systems  Constitutional: Negative for fever.  HENT: Negative for congestion and sore throat.   Eyes: Negative.   Respiratory: Negative for chest tightness and shortness of breath.   Cardiovascular: Positive for chest pain. Negative for palpitations and leg swelling.  Gastrointestinal: Negative for abdominal pain and nausea.  Genitourinary: Negative.   Musculoskeletal: Negative for arthralgias, joint swelling and neck pain.  Skin: Negative.  Negative for rash and wound.  Neurological: Negative for dizziness, weakness, light-headedness, numbness and headaches.  Psychiatric/Behavioral: Negative.      Physical Exam Updated Vital Signs BP (!) 190/104   Pulse 64   Temp 97.6 F (36.4 C) (Oral)   Resp 17   Ht 5\' 7"  (1.702 m)   Wt 96.2 kg   LMP 01/15/2012   SpO2 97%   BMI 33.20 kg/m   Physical Exam  Constitutional: She appears well-developed and well-nourished.  HENT:  Head: Normocephalic and atraumatic.  Eyes: Conjunctivae are normal.  Neck: Normal range of motion.  Cardiovascular: Normal rate, regular rhythm, normal heart sounds and intact distal pulses.   Pulmonary/Chest: Effort normal and breath sounds normal. She has no wheezes.  Abdominal: Soft. Bowel sounds are normal. There is no tenderness.  Musculoskeletal: Normal range of motion. She exhibits no edema.  Neurological: She is alert.  Skin: Skin is warm and dry.  Psychiatric: She has a normal mood and affect.  Nursing note and vitals reviewed.    ED Treatments / Results  Labs (all labs ordered are listed, but only abnormal results are displayed) Labs Reviewed  COMPREHENSIVE METABOLIC PANEL - Abnormal; Notable for the following:       Result Value   Glucose, Bld 115  (*)    Total Bilirubin 0.2 (*)    All other components within normal limits  CBC  TROPONIN I  TROPONIN I    EKG  ED ECG REPORT   Date: 04/05/2016 #1 9:47 am  Rate: 68  Rhythm: normal sinus rhythm  QRS Axis: normal  Intervals: normal  ST/T Wave abnormalities: normal  Conduction Disutrbances:none  Narrative Interpretation:   Old EKG Reviewed: unchanged  I have personally reviewed the EKG tracing and agree with the computerized printout as noted.  Repeat at 11:28  ED ECG REPORT   Date: 04/05/2016  Rate: 67  Rhythm: normal sinus rhythm  QRS Axis: normal  Intervals: normal  ST/T Wave abnormalities:  normal  Conduction Disutrbances:none  Narrative Interpretation:   Old EKG Reviewed: unchanged  I have personally reviewed the EKG tracing and agree with the computerized printout as noted.   Radiology Dg Chest 2 View  Result Date: 04/05/2016 CLINICAL DATA:  Left chest pain beginning last night. EXAM: CHEST  2 VIEW COMPARISON:  03/23/2016 FINDINGS: Heart is borderline in size. Lungs are clear. No effusions. No acute bony abnormality. No change. IMPRESSION: No active cardiopulmonary disease. Electronically Signed   By: Rolm Baptise M.D.   On: 04/05/2016 10:33   Procedures Procedures (including critical care time)  Medications Ordered in ED Medications  aspirin chewable tablet 324 mg (324 mg Oral Given 04/05/16 1052)  acetaminophen (TYLENOL) tablet 650 mg (650 mg Oral Given 04/05/16 1334)     Initial Impression / Assessment and Plan / ED Course  I have reviewed the triage vital signs and the nursing notes.  Pertinent labs & imaging results that were available during my care of the patient were reviewed by me and considered in my medical decision making (see chart for details).  Clinical Course  Comment By Time  The patient is a 55 year old female, heart pathway score is 3, presents with chest pain that started this morning at 8:00 when she was awake, nonexertional,  left sided under her breast, not associated with shortness of breath nausea vomiting or diaphoresis, no history of cardiac disease, has been walking approximately 1 mile for exercise recently and never has exertional symptoms. On exam she has clear heart and lung sounds, her laboratory values are normal, EKG is normal, she will need a second troponin prior to being discharged to follow-up in the outpatient setting. Repeat EKG also unremarkable Noemi Chapel, MD 07/30 1207   Patients  labs reviewed.  Radiological studies were viewed, interpreted and considered during the medical decision making and disposition process. I agree with radiologists reading.  Results were also discussed with patient. Advised pt she will need outpatient f/u with a cardiac stress test which can be arranged by her pcp.  Advised her to call pcp tomorrow for further assistance with this.     Final Clinical Impressions(s) / ED Diagnoses   Final diagnoses:  Chest pain, unspecified chest pain type    New Prescriptions Discharge Medication List as of 04/05/2016  2:04 PM       Evalee Jefferson, PA-C 04/05/16 1800    Noemi Chapel, MD 04/05/16 [phone]

## 2016-04-05 NOTE — ED Triage Notes (Signed)
Pt states she has been having pain in left side of chest since last night.  States last night it felt like heartburn, but this morning was different.  Denies other symptoms.

## 2016-04-08 ENCOUNTER — Other Ambulatory Visit: Payer: Self-pay

## 2016-04-09 ENCOUNTER — Ambulatory Visit: Payer: BLUE CROSS/BLUE SHIELD | Admitting: Nurse Practitioner

## 2016-04-15 ENCOUNTER — Encounter: Payer: Self-pay | Admitting: Family Medicine

## 2016-04-15 ENCOUNTER — Ambulatory Visit: Payer: BLUE CROSS/BLUE SHIELD | Admitting: Nurse Practitioner

## 2016-04-15 DIAGNOSIS — Z0289 Encounter for other administrative examinations: Secondary | ICD-10-CM

## 2016-04-23 ENCOUNTER — Ambulatory Visit (HOSPITAL_COMMUNITY): Payer: BLUE CROSS/BLUE SHIELD

## 2016-05-29 ENCOUNTER — Ambulatory Visit (INDEPENDENT_AMBULATORY_CARE_PROVIDER_SITE_OTHER): Payer: BLUE CROSS/BLUE SHIELD | Admitting: Family Medicine

## 2016-05-29 ENCOUNTER — Encounter: Payer: Self-pay | Admitting: Family Medicine

## 2016-05-29 VITALS — BP 140/92 | Temp 100.5°F | Ht 67.0 in | Wt 215.5 lb

## 2016-05-29 DIAGNOSIS — R509 Fever, unspecified: Secondary | ICD-10-CM

## 2016-05-29 DIAGNOSIS — N3000 Acute cystitis without hematuria: Secondary | ICD-10-CM | POA: Diagnosis not present

## 2016-05-29 DIAGNOSIS — R300 Dysuria: Secondary | ICD-10-CM | POA: Diagnosis not present

## 2016-05-29 LAB — POCT URINALYSIS DIPSTICK
Spec Grav, UA: <=1.005
pH, UA: 7

## 2016-05-29 MED ORDER — HYDROCODONE-ACETAMINOPHEN 5-325 MG PO TABS: 1.0000 | ORAL_TABLET | Freq: Four times a day (QID) | ORAL | 0 refills | Status: DC | PRN

## 2016-05-29 MED ORDER — CEFPROZIL 500 MG PO TABS: 500.0000 mg | ORAL_TABLET | Freq: Two times a day (BID) | ORAL | 0 refills | Status: DC

## 2016-05-29 MED ORDER — CEFTRIAXONE SODIUM 1 G IJ SOLR
1.0000 g | Freq: Once | INTRAMUSCULAR | Status: AC
  Administered 2016-05-29: 1 g via INTRAMUSCULAR

## 2016-05-29 NOTE — Progress Notes (Signed)
   Subjective:    Patient ID: Denise Hill, female    DOB: 12/25/1960, 55 y.o.   MRN: [id number]  Dysuria   This is a new problem. Episode onset: one week. Associated symptoms include chills. Associated symptoms comments: Abdominal pain, body aches, foul smelling urine, headache, chills. She has tried NSAIDs for the symptoms.   Patient states that she has a headache also.  Patient states started week ago with dysuria then urinary frequency then lower abdominal discomfort then fever headaches body aches and some chills no vomiting or diarrhea.  Review of Systems  Constitutional: Positive for chills.  Genitourinary: Positive for dysuria.   Low energy fevers body aches    Objective:   Physical Exam Patient does not appear toxic but she certainly shows she doesn't feel well neck is supple interactive lungs are clear no crackles heart regular flanks nontender to percussion lower abdominal area mild tenderness urinalysis with wbc's  Rocephin shot given today because of fever started on antibiotics warnings discuss with patient     Assessment & Plan:  I don't find any evidence of meningitis Patient has UTI. Does not appear toxic. She does look like she does not feel good. Given her fever we will do a shot of Rocephin along with Cefzil 500 one twice a day for the next 10 days if progressive symptoms go to ER

## 2016-05-29 NOTE — Patient Instructions (Signed)
Very important to take your antibiotic twice a day. Started tonight.  ibuprofen as needed for fever. May use Vicodin when necessary for headache pain but do not take Tylenol with Vicodin.  If you're fevers have not gone away by Monday you need to get rechecked if you get worse over the weekend I highly advised going to the ER.

## 2016-06-01 LAB — URINE CULTURE

## 2016-06-02 MED ORDER — AMOXICILLIN 500 MG PO CAPS: 500.0000 mg | ORAL_CAPSULE | Freq: Three times a day (TID) | ORAL | 0 refills | Status: DC

## 2016-06-02 NOTE — Addendum Note (Signed)
Addended by: Dairl Ponder on: 06/02/2016 04:33 PM   Modules accepted: Orders

## 2016-07-17 ENCOUNTER — Other Ambulatory Visit: Payer: Self-pay | Admitting: Family Medicine

## 2016-08-14 ENCOUNTER — Other Ambulatory Visit: Payer: Self-pay | Admitting: Family Medicine

## 2016-08-18 NOTE — Telephone Encounter (Signed)
Although we did prescribe this medicine back in July I would recommend that the patient call to request this medicine for a refill at this point if it was successful back then she shouldn't need ongoing Chantix. Therefore the patient needs to call for this prescription

## 2016-08-19 ENCOUNTER — Other Ambulatory Visit: Payer: Self-pay | Admitting: Family Medicine

## 2016-09-18 ENCOUNTER — Other Ambulatory Visit: Payer: Self-pay | Admitting: Family Medicine

## 2016-10-05 ENCOUNTER — Ambulatory Visit (HOSPITAL_COMMUNITY): Payer: BLUE CROSS/BLUE SHIELD

## 2016-10-17 ENCOUNTER — Other Ambulatory Visit: Payer: Self-pay | Admitting: Family Medicine

## 2016-10-27 ENCOUNTER — Ambulatory Visit (INDEPENDENT_AMBULATORY_CARE_PROVIDER_SITE_OTHER): Payer: BLUE CROSS/BLUE SHIELD | Admitting: Family Medicine

## 2016-10-27 ENCOUNTER — Encounter: Payer: Self-pay | Admitting: Family Medicine

## 2016-10-27 VITALS — BP 130/78 | Ht 67.0 in | Wt 221.0 lb

## 2016-10-27 DIAGNOSIS — M65312 Trigger thumb, left thumb: Secondary | ICD-10-CM | POA: Diagnosis not present

## 2016-10-27 MED ORDER — METFORMIN HCL 500 MG PO TABS: 500.0000 mg | ORAL_TABLET | Freq: Two times a day (BID) | ORAL | 4 refills | Status: DC

## 2016-10-27 MED ORDER — ATORVASTATIN CALCIUM 20 MG PO TABS: 20.0000 mg | ORAL_TABLET | Freq: Every day | ORAL | 4 refills | Status: DC

## 2016-10-27 MED ORDER — NAPROXEN 500 MG PO TABS: 500.0000 mg | ORAL_TABLET | Freq: Two times a day (BID) | ORAL | 0 refills | Status: DC

## 2016-10-27 MED ORDER — LISINOPRIL 5 MG PO TABS: 5.0000 mg | ORAL_TABLET | Freq: Every day | ORAL | 4 refills | Status: DC

## 2016-10-27 NOTE — Progress Notes (Signed)
   Subjective:    Patient ID: Denise Hill, female    DOB: [birth year], 56 y.o.   MRN: [id number]  HPI  Patient arrives with c/o left hand and wrist pain for about a week. Patient denies any energy mainly uses her right hand she work does not recall any injury to left hand she relates having what appears to be triggered finger she is able to grip although she has soreness and pain and then her thumb gets stuck in the interval require popping it to get it open she denies any other problems no wrist pain no elbow pain   Patient also had screening blood work at work I looked over this her LDL is very good HDL is excellent it had a fasting sugar on it but did not have A1c  Review of Systems Denies any chest tightness pressure pain shortness breath    Objective:   Physical Exam Lungs clear hearts regular pulse normal she does have tenderness in the left thumb and has trigger finger       Assessment & Plan:  Trigger thumb left side split recommended anti-inflammatory twice a day neck supple weeks, referral to orthopedics  Blood pressure good control Cholesterol good control Sugar probable good control Continue measures follow-up within 3-4 months to check A1c and do a diabetic check up, refills were given

## 2016-10-29 ENCOUNTER — Ambulatory Visit: Payer: BLUE CROSS/BLUE SHIELD | Admitting: Family Medicine

## 2016-10-30 ENCOUNTER — Other Ambulatory Visit: Payer: Self-pay | Admitting: *Deleted

## 2016-10-30 MED ORDER — ATORVASTATIN CALCIUM 20 MG PO TABS: 20.0000 mg | ORAL_TABLET | Freq: Every day | ORAL | 0 refills | Status: DC

## 2016-10-30 MED ORDER — LISINOPRIL 5 MG PO TABS: 5.0000 mg | ORAL_TABLET | Freq: Every day | ORAL | 0 refills | Status: DC

## 2016-11-02 ENCOUNTER — Other Ambulatory Visit: Payer: Self-pay | Admitting: *Deleted

## 2016-11-02 MED ORDER — METFORMIN HCL 500 MG PO TABS: 500.0000 mg | ORAL_TABLET | Freq: Two times a day (BID) | ORAL | 0 refills | Status: DC

## 2016-11-23 ENCOUNTER — Encounter: Payer: Self-pay | Admitting: Orthopedic Surgery

## 2016-11-23 ENCOUNTER — Ambulatory Visit (INDEPENDENT_AMBULATORY_CARE_PROVIDER_SITE_OTHER): Payer: BLUE CROSS/BLUE SHIELD | Admitting: Orthopedic Surgery

## 2016-11-23 VITALS — BP 163/105 | HR 83 | Ht 68.0 in | Wt 223.0 lb

## 2016-11-23 DIAGNOSIS — M65312 Trigger thumb, left thumb: Secondary | ICD-10-CM | POA: Diagnosis not present

## 2016-11-23 NOTE — Progress Notes (Signed)
Patient ID: Denise Hill, female   DOB: May 22, 1961, 56 y.o.   MRN: [id number]  Chief Complaint  Patient presents with  . Hand Problem    left thumb trigger finger    HPI Denise Hill is a 55 y.o. female.  56 years old as stated. Presents with a several month history of pain and tenderness catching locking over the A1 pulley of the left thumb without radiation numbness or tingling  Review of Systems Review of Systems  Constitutional: Positive for fever.  Skin: Negative for color change and rash.    Past Medical History:  Diagnosis Date  . Aneurysm ([place]) 2002   coil / clip  [facility]  . Chronic headaches   . Diabetes mellitus without complication ([place])   . Hypercholesteremia   . Hypertension     Past Surgical History:  Procedure Laterality Date  . APPENDECTOMY  1980  . CEREBRAL ANEURYSM REPAIR  2002   [place]   Freezing of cervical cells  . CHOLECYSTECTOMY  01/18/2012   Procedure: LAPAROSCOPIC CHOLECYSTECTOMY;  Surgeon: Donato Heinz, MD;  Location: [facility] ORS;  Service: General;  Laterality: N/A;  . COLONOSCOPY N/A 05/03/2014   Procedure: COLONOSCOPY;  Surgeon: Rogene Houston, MD;  Location: [facility] ENDO SUITE;  Service: Endoscopy;  Laterality: N/A;  830  . DILATION AND CURETTAGE OF UTERUS    . SALPINGOOPHORECTOMY  1980   Right side removal  . TUBAL LIGATION  1990;1992    Family History  Problem Relation Age of Onset  . Hypertension Mother   . Diabetes Mother   . Hyperlipidemia Mother   . Hypertension Sister   . Hypertension Sister   . Colon cancer Neg Hx     Social History Social History  Substance Use Topics  . Smoking status: Current Every Day Smoker    Packs/day: 1.00    Years: 38.00    Types: Cigarettes  . Smokeless tobacco: Never Used  . Alcohol use No    Allergies  Allergen Reactions  . Pravastatin     Tingling in mouth     Current Outpatient Prescriptions  Medication Sig Dispense Refill  . atorvastatin  (LIPITOR) 20 MG tablet Take 1 tablet (20 mg total) by mouth daily. 90 tablet 0  . ibuprofen (ADVIL,MOTRIN) 200 MG tablet Take 800 mg by mouth every 8 (eight) hours as needed for moderate pain. pain     . lisinopril (PRINIVIL,ZESTRIL) 5 MG tablet Take 1 tablet (5 mg total) by mouth daily. 90 tablet 0  . metFORMIN (GLUCOPHAGE) 500 MG tablet Take 1 tablet (500 mg total) by mouth 2 (two) times daily with a meal. 60 tablet 0  . naproxen (NAPROSYN) 500 MG tablet Take 1 tablet (500 mg total) by mouth 2 (two) times daily with a meal. 60 tablet 0   No current facility-administered medications for this visit.      Physical Exam Physical Exam Blood pressure (!) 163/105, pulse 83, height 5\' 8"  (1.727 m), weight 223 lb (101.2 kg), last menstrual period 01/15/2012. Appearance, there are no abnormalities in terms of appearance the patient was well-developed and well-nourished. The grooming and hygiene were normal.  Mental status orientation, there was normal alertness and orientation Mood pleasant Ambulatory status normal with no assistive devices  Examination of the Left hand reveals tenderness over the A1 pulley of the left thumb  Range of motion remains normal with clicking on flexion extension.  Stability tests show no abnormality of  the IP joints are MP joint. The FDP and FDS strength is normal Skin warm dry and intact without laceration or ulceration or erythema Neurologic examination normal sensation Vascular examination normal pulses with warm extremity and normal capillary refill  The opposite extremity normal right thumb range of motion without tenderness    Data Reviewed   Assessment  Encounter Diagnosis  Name Primary?  . Trigger thumb, left thumb Yes      Plan  Left Trigger thumb injection Medication  1 mL of 40 mg Depo-Medrol  2 mL of 1% lidocaine plain  Ethyl chloride for anesthesia  Verbal consent was obtained timeout was taken to confirm the injection site as left  thumb  Alcohol was used to prepare the skin along with ethyl chloride and then the injection was made at the A1 pulley there were no complications  Call if no improvement after 2 weeks

## 2016-12-10 ENCOUNTER — Ambulatory Visit (HOSPITAL_COMMUNITY)
Admission: RE | Admit: 2016-12-10 | Discharge: 2016-12-10 | Disposition: A | Discharge status: Home / Self Care | Payer: BLUE CROSS/BLUE SHIELD | Source: Ambulatory Visit | Attending: Family Medicine | Admitting: Family Medicine

## 2016-12-10 DIAGNOSIS — Z1231 Encounter for screening mammogram for malignant neoplasm of breast: Secondary | ICD-10-CM | POA: Diagnosis not present

## 2017-01-28 ENCOUNTER — Emergency Department (HOSPITAL_COMMUNITY)
Admission: EM | Admit: 2017-01-28 | Discharge: 2017-01-28 | Disposition: A | Discharge status: Home / Self Care | Payer: BLUE CROSS/BLUE SHIELD | Attending: Emergency Medicine | Admitting: Emergency Medicine

## 2017-01-28 ENCOUNTER — Encounter (HOSPITAL_COMMUNITY): Payer: Self-pay | Admitting: Emergency Medicine

## 2017-01-28 ENCOUNTER — Emergency Department (HOSPITAL_COMMUNITY): Payer: BLUE CROSS/BLUE SHIELD

## 2017-01-28 DIAGNOSIS — R079 Chest pain, unspecified: Secondary | ICD-10-CM | POA: Diagnosis not present

## 2017-01-28 DIAGNOSIS — E119 Type 2 diabetes mellitus without complications: Secondary | ICD-10-CM | POA: Insufficient documentation

## 2017-01-28 DIAGNOSIS — I10 Essential (primary) hypertension: Secondary | ICD-10-CM | POA: Diagnosis not present

## 2017-01-28 DIAGNOSIS — R0789 Other chest pain: Secondary | ICD-10-CM | POA: Insufficient documentation

## 2017-01-28 DIAGNOSIS — Z7984 Long term (current) use of oral hypoglycemic drugs: Secondary | ICD-10-CM | POA: Insufficient documentation

## 2017-01-28 DIAGNOSIS — Z79899 Other long term (current) drug therapy: Secondary | ICD-10-CM | POA: Diagnosis not present

## 2017-01-28 DIAGNOSIS — Z87891 Personal history of nicotine dependence: Secondary | ICD-10-CM | POA: Insufficient documentation

## 2017-01-28 LAB — CBC
HCT: 36.5 % (ref 36.0–46.0)
Hemoglobin: 12 g/dL (ref 12.0–15.0)
MCH: 26.8 pg (ref 26.0–34.0)
MCHC: 32.9 g/dL (ref 30.0–36.0)
MCV: 81.7 fL (ref 78.0–100.0)
Platelets: 253 10*3/uL (ref 150–400)
RBC: 4.47 MIL/uL (ref 3.87–5.11)
RDW: 13.8 % (ref 11.5–15.5)
WBC: 6.9 10*3/uL (ref 4.0–10.5)

## 2017-01-28 LAB — BASIC METABOLIC PANEL
Anion gap: 8 (ref 5–15)
BUN: 12 mg/dL (ref 6–20)
CO2: 26 mmol/L (ref 22–32)
Calcium: 9.3 mg/dL (ref 8.9–10.3)
Chloride: 106 mmol/L (ref 101–111)
Creatinine, Ser: 0.79 mg/dL (ref 0.44–1.00)
GFR calc Af Amer: >60 mL/min (ref >60)
GFR calc non Af Amer: >60 mL/min (ref >60)
Glucose, Bld: 139 mg/dL — ABNORMAL HIGH (ref 65–99)
Potassium: 3.9 mmol/L (ref 3.5–5.1)
Sodium: 140 mmol/L (ref 135–145)

## 2017-01-28 LAB — CBG MONITORING, ED: Glucose-Capillary: 135 mg/dL — ABNORMAL HIGH (ref 65–99)

## 2017-01-28 LAB — I-STAT TROPONIN, ED: Troponin i, poc: 0.01 ng/mL (ref 0.00–0.08)

## 2017-01-28 LAB — TROPONIN I: Troponin I: <0.03 ng/mL (ref <0.03)

## 2017-01-28 MED ORDER — KETOROLAC TROMETHAMINE 30 MG/ML IJ SOLN
30.0000 mg | Freq: Once | INTRAMUSCULAR | Status: AC
  Administered 2017-01-28: 30 mg via INTRAVENOUS
  Filled 2017-01-28: qty 1

## 2017-01-28 NOTE — Discharge Instructions (Signed)
Tests showed no life-threatening condition. Tylenol and/or Ibuprofen for pain. I discussed your case with the doctor on call. The office will contact you for an appointment. If you do not hear anything by tomorrow at early afternoon, call the office number.

## 2017-01-28 NOTE — ED Notes (Signed)
Pt did not take her BP meds this am (because she came to the ED)

## 2017-01-28 NOTE — ED Triage Notes (Signed)
Pt c/o sudden chest heaviness to left side 30 min pta with nausea and feeling hot. Denies sob/dizziness. Non diaphoretic. A/o. Pain worse with certain movements.

## 2017-01-28 NOTE — ED Provider Notes (Signed)
[facility] DEPT Provider Note   CSN: [id number] Arrival date & time: 01/28/17  1135  By signing my name below, I, Denise Hill, attest that this documentation has been prepared under the direction and in the presence of Denise Christen, MD . Electronically Signed: Neta Hill, ED Scribe. 01/28/2017. 12:41 PM.   History   Chief Complaint Chief Complaint  Patient presents with  . Chest Pain     The history is provided by the patient. No language interpreter was used.   HPI Comments:  Denise Hill is a 56 y.o. female with PMHx of HTN, DM and HLD who presents to the Emergency Department complaining of sudden onset, constant chest pain that began ~2 hours ago. Pt reports that she suddenly felt "sharp, stabbing" pain on her left chest, superior to the left breast. She states that the pain is worse with changing positions. Pt complains of associated nausea, diaphoresis. Pt is not a smoker. She reports that her mother had a pacemaker paced at 10 y.o. No alleviating factors noted. Denies dizziness, SOB.   Past Medical History:  Diagnosis Date  . Aneurysm ([place]) 2002   coil / clip  [facility]  . Chronic headaches   . Diabetes mellitus without complication ([place])   . Hypercholesteremia   . Hypertension     Patient Active Problem List   Diagnosis Date Noted  . Type 2 diabetes mellitus ([place]) 07/20/2013  . Hyperlipidemia 07/20/2013  . Osteoarthritis of both knees 07/20/2013  . Essential hypertension, benign 06/14/2013    Past Surgical History:  Procedure Laterality Date  . APPENDECTOMY  1980  . CEREBRAL ANEURYSM REPAIR  2002   [place]   Freezing of cervical cells  . CHOLECYSTECTOMY  01/18/2012   Procedure: LAPAROSCOPIC CHOLECYSTECTOMY;  Surgeon: Donato Heinz, MD;  Location: [facility] ORS;  Service: General;  Laterality: N/A;  . COLONOSCOPY N/A 05/03/2014   Procedure: COLONOSCOPY;  Surgeon: Rogene Houston, MD;  Location: [facility] ENDO [facility];   Service: Endoscopy;  Laterality: N/A;  830  . DILATION AND CURETTAGE OF UTERUS    . SALPINGOOPHORECTOMY  1980   Right side removal  . TUBAL LIGATION  1990;1992    OB History    No data available       Home Medications    Prior to Admission medications   Medication Sig Start Date End Date Taking? Authorizing Provider  atorvastatin (LIPITOR) 20 MG tablet Take 1 tablet (20 mg total) by mouth daily. 10/30/16  Yes Kathyrn Drown, MD  ibuprofen (ADVIL,MOTRIN) 200 MG tablet Take 800 mg by mouth every 8 (eight) hours as needed for moderate pain. pain    Yes [provider]  lisinopril (PRINIVIL,ZESTRIL) 5 MG tablet Take 1 tablet (5 mg total) by mouth daily. 10/30/16  Yes Kathyrn Drown, MD  metFORMIN (GLUCOPHAGE) 500 MG tablet Take 1 tablet (500 mg total) by mouth 2 (two) times daily with a meal. 11/02/16  Yes Luking, Elayne Snare, MD  Multiple Vitamins-Minerals (ONE-A-DAY 50 PLUS PO) Take 1 tablet by mouth daily.   Yes [provider]  naproxen (NAPROSYN) 500 MG tablet Take 1 tablet (500 mg total) by mouth 2 (two) times daily with a meal. 10/27/16  Yes Luking, Elayne Snare, MD    Family History Family History  Problem Relation Age of Onset  . Hypertension Mother   . Diabetes Mother   . Hyperlipidemia Mother   . Hypertension Sister   . Hypertension  Sister   . Colon cancer Neg Hx     Social History Social History  Substance Use Topics  . Smoking status: Former Smoker    Packs/day: 1.00    Years: 38.00    Types: Cigarettes    Quit date: 03/30/2016  . Smokeless tobacco: Never Used  . Alcohol use No     Allergies   Pravastatin   Review of Systems Review of Systems All systems reviewed and are negative for acute change except as noted in the HPI.   Physical Exam Updated Vital Signs BP (!) 175/91   Pulse 74   Temp 98.3 F (36.8 C) (Oral)   Resp 16   Ht 5\' 8"  (1.727 m)   Wt 99.8 kg (220 lb)   LMP 01/15/2012   SpO2 98%   BMI 33.45 kg/m   Physical Exam    Constitutional: She is oriented to person, place, and time. She appears well-developed and well-nourished.  HENT:  Head: Normocephalic and atraumatic.  Eyes: Conjunctivae are normal.  Neck: Neck supple.  Cardiovascular: Normal rate and regular rhythm.   Pulmonary/Chest: Effort normal and breath sounds normal.  Abdominal: Soft. Bowel sounds are normal.  Musculoskeletal: Normal range of motion.  Neurological: She is alert and oriented to person, place, and time.  Skin: Skin is warm and dry.  Psychiatric: She has a normal mood and affect. Her behavior is normal.  Nursing note and vitals reviewed.    ED Treatments / Results  DIAGNOSTIC STUDIES:  Oxygen Saturation is 99% on RA, normal by my interpretation.    COORDINATION OF CARE:  12:37 PM Will order cardiac workup. Discussed treatment plan with pt at bedside and pt agreed to plan.   Labs (all labs ordered are listed, but only abnormal results are displayed) Labs Reviewed  BASIC METABOLIC PANEL - Abnormal; Notable for the following:       Result Value   Glucose, Bld 139 (*)    All other components within normal limits  CBG MONITORING, ED - Abnormal; Notable for the following:    Glucose-Capillary 135 (*)    All other components within normal limits  CBC  TROPONIN I  I-STAT TROPOININ, ED    EKG  EKG Interpretation  Date/Time:  Thursday Jan 28 2017 11:44:08 EDT Ventricular Rate:  77 PR Interval:    QRS Duration: 94 QT Interval:  405 QTC Calculation: 459 R Axis:   17 Text Interpretation:  Sinus rhythm LAE, consider biatrial enlargement Baseline wander in lead(s) II III aVL aVF V1 V3 V4 V6 Confirmed by Lacinda Axon  MD, Tiyanna Larcom ([id number]) on 01/28/2017 2:08:31 PM       Radiology Dg Chest 2 View  Result Date: 01/28/2017 CLINICAL DATA:  LEFT side chest pain for 1 hour, diabetes mellitus, hypertension, former smoker EXAM: CHEST  2 VIEW COMPARISON:  04/05/2016 FINDINGS: Enlargement of cardiac silhouette. Mediastinal contours and  pulmonary vascularity normal. Lungs clear. No pulmonary infiltrate, pleural effusion or pneumothorax. Bones demineralized. IMPRESSION: Enlargement of cardiac silhouette. No acute abnormalities. Electronically Signed   By: Lavonia Dana M.D.   On: 01/28/2017 12:31    Procedures Procedures (including critical care time)  Medications Ordered in ED Medications  ketorolac (TORADOL) 30 MG/ML injection 30 mg (30 mg Intravenous Given 01/28/17 1331)     Initial Impression / Assessment and Plan / ED Course  I have reviewed the triage vital signs and the nursing notes.  Pertinent labs & imaging results that were available during my care of the patient were  reviewed by me and considered in my medical decision making (see chart for details).     Patient presents with atypical chest pain. Troponins negative 2. EKG negative. She has been resting comfortable without any diaphoresis or dyspnea. Discussed with cardiologist on-call. Will arrange outpatient follow-up. This was discussed with the patient and her 2 daughters  Final Clinical Impressions(s) / ED Diagnoses   Final diagnoses:  Chest pain, unspecified type    New Prescriptions New Prescriptions   No medications on file  I personally performed the services described in this documentation, which was scribed in my presence. The recorded information has been reviewed and is accurate.      Denise Christen, MD 01/28/17 [phone]

## 2017-02-17 ENCOUNTER — Ambulatory Visit: Payer: BLUE CROSS/BLUE SHIELD | Admitting: Family Medicine

## 2017-06-01 ENCOUNTER — Ambulatory Visit (INDEPENDENT_AMBULATORY_CARE_PROVIDER_SITE_OTHER): Payer: BLUE CROSS/BLUE SHIELD | Admitting: Family Medicine

## 2017-06-01 ENCOUNTER — Encounter: Payer: Self-pay | Admitting: Family Medicine

## 2017-06-01 VITALS — BP 136/90 | Ht 67.0 in | Wt 224.1 lb

## 2017-06-01 DIAGNOSIS — E784 Other hyperlipidemia: Secondary | ICD-10-CM | POA: Diagnosis not present

## 2017-06-01 DIAGNOSIS — E119 Type 2 diabetes mellitus without complications: Secondary | ICD-10-CM

## 2017-06-01 DIAGNOSIS — I10 Essential (primary) hypertension: Secondary | ICD-10-CM | POA: Diagnosis not present

## 2017-06-01 DIAGNOSIS — E7849 Other hyperlipidemia: Secondary | ICD-10-CM

## 2017-06-01 LAB — POCT GLYCOSYLATED HEMOGLOBIN (HGB A1C): Hemoglobin A1C: 8.8

## 2017-06-01 MED ORDER — METFORMIN HCL 500 MG PO TABS: 500.0000 mg | ORAL_TABLET | Freq: Two times a day (BID) | ORAL | 5 refills | Status: DC

## 2017-06-01 MED ORDER — LISINOPRIL 10 MG PO TABS: 10.0000 mg | ORAL_TABLET | Freq: Every day | ORAL | 5 refills | Status: DC

## 2017-06-01 MED ORDER — ATORVASTATIN CALCIUM 20 MG PO TABS: 20.0000 mg | ORAL_TABLET | Freq: Every day | ORAL | 1 refills | Status: DC

## 2017-06-01 MED ORDER — METFORMIN HCL 500 MG PO TABS: ORAL_TABLET | ORAL | 5 refills | Status: DC

## 2017-06-01 MED ORDER — LISINOPRIL 5 MG PO TABS: 5.0000 mg | ORAL_TABLET | Freq: Every day | ORAL | 1 refills | Status: DC

## 2017-06-01 MED ORDER — GLIPIZIDE 5 MG PO TABS: ORAL_TABLET | ORAL | 5 refills | Status: DC

## 2017-06-01 NOTE — Patient Instructions (Signed)
Please check your sugars periodically  Please write your glucose readings on the log sheets and send them to me every several weeks  Please follow-up in 4 months  Metformin-new instruction-one half tablet twice daily  Glipizide 5 mg one half tablet twice daily with meals if having low sugars please alert [place]  Please do your lab work in the coming weeks  Lisinopril need dose 10 mg daily to help bring your blood pressure under better control  I do recommend a preventative wellness exam with Hoyle Sauer at your convenience in the coming months

## 2017-06-01 NOTE — Progress Notes (Signed)
   Subjective:    Patient ID: Denise Hill, female    DOB: 1961/04/06, 56 y.o.   MRN: [id number]  Diabetes  She presents for her follow-up diabetic visit. She has type 2 diabetes mellitus. Pertinent negatives for hypoglycemia include no confusion. Pertinent negatives for diabetes include no chest pain, no fatigue, no polydipsia, no polyphagia and no weakness. She has not had a previous visit with a dietitian. She does not see a podiatrist.Eye exam is current.   This patient does not check her glucose on a regular basis She states she is taking 1 metformin per day The patient relates that it does cause diarrhea Patient has concerns of diarrhea associated with Metformin.  She relates it as watery stool She does take her cholesterol medicine She tries watch her diet In addition to this she does take her blood pressure medicines She denies any problems with the medicine She admits that she does not always watch how she eats. She is planning on starting a keto diet in order to try to lose weight she wonders if that is safe She denies any chest pressure tightness or pain denies exercising on a regular basis. Results for orders placed or performed in visit on 06/01/17  POCT HgB A1C  Result Value Ref Range   Hemoglobin A1C 8.8    She does have blood pressure issues which she is taking medicine tolerates it well She also has hyperlipidemia for which she takes medicine tolerates it well watch his diet to some degree Patient under a lot of stress with family issues work issues but not depressed Review of Systems  Constitutional: Negative for activity change, appetite change and fatigue.  HENT: Negative for congestion.   Respiratory: Negative for cough.   Cardiovascular: Negative for chest pain.  Gastrointestinal: Negative for abdominal pain.  Endocrine: Negative for polydipsia and polyphagia.  Skin: Negative for color change.  Neurological: Negative for weakness.  Psychiatric/Behavioral:  Negative for confusion.       Objective:   Physical Exam  Constitutional: She appears well-developed and well-nourished. No distress.  HENT:  Head: Normocephalic and atraumatic.  Eyes: Right eye exhibits no discharge. Left eye exhibits no discharge.  Neck: No tracheal deviation present.  Cardiovascular: Normal rate, regular rhythm and normal heart sounds.   No murmur heard. Pulmonary/Chest: Effort normal and breath sounds normal. No respiratory distress. She has no wheezes. She has no rales.  Musculoskeletal: She exhibits no edema.  Lymphadenopathy:    She has no cervical adenopathy.  Neurological: She is alert. She exhibits normal muscle tone.  Skin: Skin is warm and dry. No erythema.  Psychiatric: Her behavior is normal.  Vitals reviewed.  Diabetic foot exam normal Female wellness recommended Patient stress but denies depression     Assessment & Plan:  Poorly controlled diabetes Metformin we will continue this but I recommend a half tablet twice a day if ongoing diarrhea issues to let us know Patient cannot afford higher cost medicine Try glipizide 5 mg one half tablet in the morning one half supper Patient is a check her glucoses on a regular basis send me some readings every several weeks Diabetic educator dietitian Patient states that she will start keto diet in hopes that that will help Blood pressure borderline ideally would like to see blood pressure 130/70 I think it is in the patient's best interest to go ahead and be on a higher dose lisinopril Lab work indicated Hyperlipidemia fair control Follow-up 4 months

## 2017-06-02 ENCOUNTER — Telehealth: Payer: Self-pay | Admitting: Family Medicine

## 2017-06-02 MED ORDER — GLUCOSE BLOOD VI STRP: ORAL_STRIP | 5 refills | Status: AC

## 2017-06-02 NOTE — Telephone Encounter (Signed)
Patient was here this week and was told to start checking blood sugars more often.  She needs the test strips for Contour Next meter.   [facility] [place].

## 2017-06-02 NOTE — Telephone Encounter (Signed)
Prescription sent electronically to pharmacy. Patient notified. 

## 2017-06-08 ENCOUNTER — Other Ambulatory Visit: Payer: Self-pay | Admitting: *Deleted

## 2017-06-08 MED ORDER — METFORMIN HCL 500 MG PO TABS: ORAL_TABLET | ORAL | 5 refills | Status: DC

## 2017-06-22 DIAGNOSIS — E7849 Other hyperlipidemia: Secondary | ICD-10-CM | POA: Diagnosis not present

## 2017-06-22 DIAGNOSIS — E119 Type 2 diabetes mellitus without complications: Secondary | ICD-10-CM | POA: Diagnosis not present

## 2017-06-22 DIAGNOSIS — I10 Essential (primary) hypertension: Secondary | ICD-10-CM | POA: Diagnosis not present

## 2017-06-23 LAB — LIPID PANEL
Chol/HDL Ratio: 3.2 ratio (ref 0.0–4.4)
Cholesterol, Total: 155 mg/dL (ref 100–199)
HDL: 48 mg/dL (ref >39)
LDL Calculated: 86 mg/dL (ref 0–99)
Triglycerides: 106 mg/dL (ref 0–149)
VLDL Cholesterol Cal: 21 mg/dL (ref 5–40)

## 2017-06-23 LAB — HEPATIC FUNCTION PANEL
ALT: 18 IU/L (ref 0–32)
AST: 19 IU/L (ref 0–40)
Albumin: 4 g/dL (ref 3.5–5.5)
Alkaline Phosphatase: 80 IU/L (ref 39–117)
Bilirubin Total: 0.4 mg/dL (ref 0.0–1.2)
Bilirubin, Direct: 0.13 mg/dL (ref 0.00–0.40)
Total Protein: 7.2 g/dL (ref 6.0–8.5)

## 2017-06-23 LAB — BASIC METABOLIC PANEL
BUN/Creatinine Ratio: 13 (ref 9–23)
BUN: 11 mg/dL (ref 6–24)
CO2: 25 mmol/L (ref 20–29)
Calcium: 9.9 mg/dL (ref 8.7–10.2)
Chloride: 106 mmol/L (ref 96–106)
Creatinine, Ser: 0.82 mg/dL (ref 0.57–1.00)
GFR calc Af Amer: 94 mL/min/{1.73_m2} (ref >59)
GFR calc non Af Amer: 81 mL/min/{1.73_m2} (ref >59)
Glucose: 154 mg/dL — ABNORMAL HIGH (ref 65–99)
Potassium: 4.5 mmol/L (ref 3.5–5.2)
Sodium: 143 mmol/L (ref 134–144)

## 2017-06-23 LAB — MICROALBUMIN / CREATININE URINE RATIO
Creatinine, Urine: 226.7 mg/dL
Microalb/Creat Ratio: 2.1 mg/g creat (ref 0.0–30.0)
Microalbumin, Urine: 4.7 ug/mL

## 2017-07-20 DIAGNOSIS — E119 Type 2 diabetes mellitus without complications: Secondary | ICD-10-CM | POA: Diagnosis not present

## 2017-07-20 DIAGNOSIS — H18413 Arcus senilis, bilateral: Secondary | ICD-10-CM | POA: Diagnosis not present

## 2017-09-30 ENCOUNTER — Encounter: Payer: Self-pay | Admitting: Family Medicine

## 2017-09-30 ENCOUNTER — Ambulatory Visit: Payer: BLUE CROSS/BLUE SHIELD | Admitting: Family Medicine

## 2017-09-30 VITALS — BP 140/86 | Ht 67.0 in | Wt 222.8 lb

## 2017-09-30 DIAGNOSIS — I10 Essential (primary) hypertension: Secondary | ICD-10-CM | POA: Diagnosis not present

## 2017-09-30 DIAGNOSIS — Z23 Encounter for immunization: Secondary | ICD-10-CM | POA: Diagnosis not present

## 2017-09-30 DIAGNOSIS — E7849 Other hyperlipidemia: Secondary | ICD-10-CM | POA: Diagnosis not present

## 2017-09-30 DIAGNOSIS — E119 Type 2 diabetes mellitus without complications: Secondary | ICD-10-CM

## 2017-09-30 LAB — POCT GLYCOSYLATED HEMOGLOBIN (HGB A1C): Hemoglobin A1C: 8

## 2017-09-30 MED ORDER — VARENICLINE TARTRATE 1 MG PO TABS: 1.0000 mg | ORAL_TABLET | Freq: Two times a day (BID) | ORAL | 0 refills | Status: DC

## 2017-09-30 MED ORDER — GLIPIZIDE 5 MG PO TABS: ORAL_TABLET | ORAL | 5 refills | Status: DC

## 2017-09-30 MED ORDER — ATORVASTATIN CALCIUM 20 MG PO TABS: 20.0000 mg | ORAL_TABLET | Freq: Every day | ORAL | 1 refills | Status: DC

## 2017-09-30 MED ORDER — LISINOPRIL 10 MG PO TABS: 10.0000 mg | ORAL_TABLET | Freq: Every day | ORAL | 5 refills | Status: DC

## 2017-09-30 MED ORDER — METFORMIN HCL 500 MG PO TABS: ORAL_TABLET | ORAL | 5 refills | Status: DC

## 2017-09-30 MED ORDER — VARENICLINE TARTRATE 0.5 MG X 11 & 1 MG X 42 PO MISC: ORAL | 0 refills | Status: DC

## 2017-09-30 NOTE — Progress Notes (Signed)
Subjective:    Patient ID: Denise Hill, female    DOB: 1961-08-26, 57 y.o.   MRN: [id number]  Diabetes  She presents for her follow-up diabetic visit. She has type 2 diabetes mellitus. Pertinent negatives for hypoglycemia include no confusion. Pertinent negatives for diabetes include no chest pain, no fatigue, no polydipsia, no polyphagia and no weakness. Risk factors for coronary artery disease include post-menopausal. Current diabetic treatment includes oral agent (dual therapy). She is compliant with treatment all of the time. Her weight is stable. She is following a diabetic diet.   Patient for blood pressure check up. Patient relates compliance with meds. Todays BP reviewed with the patient. Patient denies issues with medication. Patient relates reasonable diet. Patient tries to minimize salt. Patient aware of BP goals.  Patient here for follow-up regarding cholesterol.  Patient does try to maintain a reasonable diet.  Patient does take the medication on a regular basis.  Denies missing a dose.  The patient denies any obvious side effects.  Prior blood work results reviewed with the patient.  The patient is aware of his cholesterol goals and the need to keep it under good control to lessen the risk of disease.  Would like to restart chantix- started back smoking  Depression screen [facility] 2/9 09/30/2017 01/23/2014  Decreased Interest 2 0  Down, Depressed, Hopeless 1 0  PHQ - 2 Score 3 0  Altered sleeping 0 -  Tired, decreased energy 2 -  Change in appetite 1 -  Feeling bad or failure about yourself  1 -  Trouble concentrating 0 -  Moving slowly or fidgety/restless 0 -  Suicidal thoughts 0 -  PHQ-9 Score 7 -    Review of Systems  Constitutional: Negative for activity change, appetite change and fatigue.  HENT: Negative for congestion.   Respiratory: Negative for cough.   Cardiovascular: Negative for chest pain.  Gastrointestinal: Negative for abdominal pain.  Endocrine: Negative  for polydipsia and polyphagia.  Skin: Negative for color change.  Neurological: Negative for weakness.  Psychiatric/Behavioral: Negative for confusion.       Objective:   Physical Exam  Constitutional: She appears well-developed and well-nourished. No distress.  HENT:  Head: Normocephalic and atraumatic.  Eyes: Right eye exhibits no discharge. Left eye exhibits no discharge.  Neck: No tracheal deviation present.  Cardiovascular: Normal rate, regular rhythm and normal heart sounds.  No murmur heard. Pulmonary/Chest: Effort normal and breath sounds normal. No respiratory distress. She has no wheezes. She has no rales.  Musculoskeletal: She exhibits no edema.  Lymphadenopathy:    She has no cervical adenopathy.  Neurological: She is alert. She exhibits normal muscle tone.  Skin: Skin is warm and dry. No erythema.  Psychiatric: Her behavior is normal.  Vitals reviewed.         Assessment & Plan:  HTN- Patient was seen today as part of a visit regarding hypertension. The importance of healthy diet and regular physical activity was discussed. The importance of compliance with medications discussed. Ideal goal is to keep blood pressure low elevated levels certainly below 287/68 when possible. The patient was counseled that keeping blood pressure under control lessen his risk of heart attack, stroke, kidney failure, and early death. The importance of regular follow-ups was discussed with the patient. Low-salt diet such as DASH recommended. Regular physical activity was recommended as well. Patient was advised to keep regular follow-ups.  The patient was seen today as part of a comprehensive visit for diabetes. The importance  of keeping her A1c at or below 7 was discussed. Importance of regular physical activity was discussed. Proper monitoring of glucose levels with glucometer discussed. The importance of adherence to medication as well as a controlled low starch/sugar diet was also  discussed. Also discussion regarding the importance of diabetic foot checks including self check every day. Also yearly diabetic eye exams recommended. The importance of keeping blood pressure under control and keeping LDL below 70 was also discussed. Also the importance of avoiding smoking. Standard follow-up visit recommended. Finally failure to follow good diabetic measures including self effort and compliance with recommendations can certainly increase the risk of heart disease strokes kidney failure blindness loss of limb and early death was discussed with the patient.  Subpar diabetic control she will now start taking glipizide half tablet twice a day she will send [place] some readings in several weeks she will also do lab work in 3 months with follow-up office visit

## 2017-10-20 ENCOUNTER — Encounter: Payer: Self-pay | Admitting: Nurse Practitioner

## 2017-10-20 ENCOUNTER — Ambulatory Visit (INDEPENDENT_AMBULATORY_CARE_PROVIDER_SITE_OTHER): Payer: BLUE CROSS/BLUE SHIELD | Admitting: Nurse Practitioner

## 2017-10-20 VITALS — BP 122/88 | Ht 67.0 in | Wt 220.0 lb

## 2017-10-20 DIAGNOSIS — Z01419 Encounter for gynecological examination (general) (routine) without abnormal findings: Secondary | ICD-10-CM

## 2017-10-20 DIAGNOSIS — N950 Postmenopausal bleeding: Secondary | ICD-10-CM | POA: Diagnosis not present

## 2017-10-20 DIAGNOSIS — N939 Abnormal uterine and vaginal bleeding, unspecified: Secondary | ICD-10-CM | POA: Diagnosis not present

## 2017-10-21 ENCOUNTER — Other Ambulatory Visit: Payer: Self-pay | Admitting: Nurse Practitioner

## 2017-10-21 DIAGNOSIS — E559 Vitamin D deficiency, unspecified: Secondary | ICD-10-CM

## 2017-10-21 LAB — VITAMIN D 25 HYDROXY (VIT D DEFICIENCY, FRACTURES): Vit D, 25-Hydroxy: 15.8 ng/mL — ABNORMAL LOW (ref 30.0–100.0)

## 2017-10-21 LAB — FOLLICLE STIMULATING HORMONE: FSH: 37.1 m[IU]/mL

## 2017-10-21 MED ORDER — VITAMIN D (ERGOCALCIFEROL) 1.25 MG (50000 UNIT) PO CAPS: 50000.0000 [IU] | ORAL_CAPSULE | ORAL | 2 refills | Status: DC

## 2017-10-22 ENCOUNTER — Encounter: Payer: Self-pay | Admitting: Nurse Practitioner

## 2017-10-22 DIAGNOSIS — N950 Postmenopausal bleeding: Secondary | ICD-10-CM | POA: Insufficient documentation

## 2017-10-22 NOTE — Progress Notes (Signed)
   Subjective:    Patient ID: Denise Hill, female    DOB: 1960/11/20, 57 y.o.   MRN: [id number]  HPI Presents for her wellness exam. Had a menses once a year for 3-4 years then has had 2 irregular cycles with regular to light flow since December 2018. No current or recent sexual partners. Has been working on diet. Very active job requiring walking. Regular eye exam. Needs dental. Plans to start Chantix soon for smoking cessation.     Review of Systems  Constitutional: Negative for activity change, appetite change and fatigue.  HENT: Negative for dental problem, ear pain, sinus pressure, sore throat and trouble swallowing.   Respiratory: Negative for cough, chest tightness, shortness of breath and wheezing.   Cardiovascular: Negative for chest pain.  Gastrointestinal: Negative for abdominal distention, abdominal pain, blood in stool, constipation, diarrhea, nausea and vomiting.  Genitourinary: Positive for menstrual problem. Negative for difficulty urinating, dysuria, enuresis, frequency, genital sores, pelvic pain, urgency and vaginal discharge.   Depression screen [facility] 2/9 09/30/2017 01/23/2014  Decreased Interest 2 0  Down, Depressed, Hopeless 1 0  PHQ - 2 Score 3 0  Altered sleeping 0 -  Tired, decreased energy 2 -  Change in appetite 1 -  Feeling bad or failure about yourself  1 -  Trouble concentrating 0 -  Moving slowly or fidgety/restless 0 -  Suicidal thoughts 0 -  PHQ-9 Score 7 -        Objective:   Physical Exam  Constitutional: She is oriented to person, place, and time. She appears well-developed. No distress.  HENT:  Right Ear: External ear normal.  Left Ear: External ear normal.  Mouth/Throat: Oropharynx is clear and moist.  Neck: Normal range of motion. Neck supple. No tracheal deviation present. No thyromegaly present.  Cardiovascular: Normal rate, regular rhythm and normal heart sounds. Exam reveals no gallop.  No murmur heard. Pulmonary/Chest: Effort normal  and breath sounds normal. Right breast exhibits no inverted nipple, no mass, no skin change and no tenderness. Left breast exhibits no inverted nipple, no mass, no skin change and no tenderness. Breasts are symmetrical.  Axillae no adenopathy.   Abdominal: Soft. She exhibits no distension. There is no tenderness.  Genitourinary: Vagina normal and uterus normal. No vaginal discharge found.  Genitourinary Comments: External GU: no rashes or lesions. Vagina: no discharge. Bimanual exam: no tenderness or obvious masses. Exam limited due to abd girth.   Musculoskeletal: She exhibits no edema.  Lymphadenopathy:    She has no cervical adenopathy.  Neurological: She is alert and oriented to person, place, and time.  Skin: Skin is warm and dry. No rash noted.  Psychiatric: She has a normal mood and affect. Her behavior is normal.  Vitals reviewed.         Assessment & Plan:   Problem List Items Addressed This Visit      Other   Post-menopausal bleeding   Relevant Orders   FSH (Completed)   US Pelvis Complete   Vitamin D (25 hydroxy) (Completed)    Other Visit Diagnoses    Well woman exam    -  Primary   Abnormal uterine bleeding       Relevant Orders   FSH (Completed)   US Pelvis Complete   Vitamin D (25 hydroxy) (Completed)     Labs and [place] pending. Further follow up based on results.  Recommend healthy diet and weight loss. Continue follow up for chronic health issues.

## 2017-10-25 ENCOUNTER — Other Ambulatory Visit: Payer: Self-pay | Admitting: *Deleted

## 2017-10-25 ENCOUNTER — Telehealth: Payer: Self-pay | Admitting: Nurse Practitioner

## 2017-10-25 DIAGNOSIS — N950 Postmenopausal bleeding: Secondary | ICD-10-CM

## 2017-10-25 DIAGNOSIS — N939 Abnormal uterine and vaginal bleeding, unspecified: Secondary | ICD-10-CM

## 2017-10-25 NOTE — Telephone Encounter (Signed)
Order has been changed

## 2017-10-25 NOTE — Telephone Encounter (Signed)
Is it ok to change order?  

## 2017-10-25 NOTE — Telephone Encounter (Signed)
Yes. Thanks 

## 2017-10-25 NOTE — Telephone Encounter (Signed)
Per centralized scheduling - due to pt's Dx - need to order trans abdominal with trans vaginal U/S  Please put in new orders so I may schedule

## 2017-10-29 ENCOUNTER — Ambulatory Visit (HOSPITAL_COMMUNITY)
Admission: RE | Admit: 2017-10-29 | Discharge: 2017-10-29 | Disposition: A | Discharge status: Home / Self Care | Payer: BLUE CROSS/BLUE SHIELD | Source: Ambulatory Visit | Attending: Nurse Practitioner | Admitting: Nurse Practitioner

## 2017-10-29 DIAGNOSIS — D259 Leiomyoma of uterus, unspecified: Secondary | ICD-10-CM | POA: Diagnosis not present

## 2017-10-29 DIAGNOSIS — N950 Postmenopausal bleeding: Secondary | ICD-10-CM | POA: Insufficient documentation

## 2017-10-29 DIAGNOSIS — Z90721 Acquired absence of ovaries, unilateral: Secondary | ICD-10-CM | POA: Diagnosis not present

## 2017-10-29 DIAGNOSIS — N939 Abnormal uterine and vaginal bleeding, unspecified: Secondary | ICD-10-CM | POA: Diagnosis not present

## 2017-11-01 ENCOUNTER — Other Ambulatory Visit: Payer: Self-pay

## 2017-11-01 DIAGNOSIS — R87629 Unspecified abnormal cytological findings in specimens from vagina: Secondary | ICD-10-CM

## 2017-11-02 ENCOUNTER — Encounter: Payer: Self-pay | Admitting: Family Medicine

## 2017-11-22 ENCOUNTER — Encounter: Payer: Self-pay | Admitting: Obstetrics and Gynecology

## 2017-11-22 ENCOUNTER — Ambulatory Visit: Payer: BLUE CROSS/BLUE SHIELD | Admitting: Obstetrics and Gynecology

## 2017-11-22 ENCOUNTER — Other Ambulatory Visit: Payer: Self-pay

## 2017-11-22 ENCOUNTER — Other Ambulatory Visit: Payer: Self-pay | Admitting: Obstetrics and Gynecology

## 2017-11-22 VITALS — BP 142/90 | HR 78 | Ht 67.5 in | Wt 220.2 lb

## 2017-11-22 DIAGNOSIS — N950 Postmenopausal bleeding: Secondary | ICD-10-CM | POA: Diagnosis not present

## 2017-11-22 NOTE — Progress Notes (Signed)
Endometrial Biopsy: Patient given informed consent, signed copy in the chart, time out was performed. Time out taken. The patient was placed in the lithotomy position and the cervix brought into view with sterile speculum.  Portio of cervix cleansed x 2 with betadine swabs.  A tenaculum was placed in the anterior lip of the cervix. The uterus was sounded for depth of 7 cm,.  Ultrasound was done prior to the procedure to confirm endometrial orientation Milex uterine Explora 3 mm was introduced to into the uterus, suction created,  and an endometrial sample was obtained. All equipment was removed and accounted for.  Scanty amount of tissue obtained The patient tolerated the procedure only with some difficulty as she found it extremely uncomfortable   Patient given post procedure instructions.  Followup: 2 weeks we will try to call her with the results before the appointment and inform her using my chart

## 2017-12-08 ENCOUNTER — Ambulatory Visit: Payer: BLUE CROSS/BLUE SHIELD | Admitting: Obstetrics and Gynecology

## 2017-12-08 VITALS — BP 140/80 | HR 80 | Ht 67.5 in | Wt 222.0 lb

## 2017-12-08 DIAGNOSIS — N950 Postmenopausal bleeding: Secondary | ICD-10-CM

## 2017-12-08 DIAGNOSIS — R232 Flushing: Secondary | ICD-10-CM | POA: Diagnosis not present

## 2017-12-08 MED ORDER — ESTRADIOL 0.1 MG/24HR TD PTTW: 1.0000 | MEDICATED_PATCH | TRANSDERMAL | 12 refills | Status: DC

## 2017-12-08 MED ORDER — PROMETRIUM 200 MG PO CAPS: 200.0000 mg | ORAL_CAPSULE | Freq: Every day | ORAL | 3 refills | Status: DC

## 2017-12-08 MED ORDER — MEDROXYPROGESTERONE ACETATE 10 MG PO TABS: 10.0000 mg | ORAL_TABLET | Freq: Every day | ORAL | 1 refills | Status: DC

## 2017-12-08 NOTE — Progress Notes (Signed)
Patient ID: Denise Hill, female   DOB: 1961/07/01, 57 y.o.   MRN: [id number]  Discussion: Discussed with pt the results of her recent endometrial biopsy that was done on 11/22/2017. Prior to her biopsy she was experiencing PMB. She states in December 2018 she had a real period. In January 2019 she had two light periods. The patient endorses hot flashes as well.   Her results were negative for cancer and precancer.   At end of discussion, pt had opportunity to ask questions and has no further questions at this time.   Specific discussion of endometrial biopsy results as noted above. Greater than 50% was spent in counseling and coordination of care with the patient.   Total time greater than: 15 minutes.   A: 1. PMB, benign endometrium  P: 1. Rx provera x 1 2. Rx patches Vivelle Dot 0.1 with monthly Prometrium 200 po x 2 wk 3. F/u 3 months  By signing my name below, I, Margit Banda, attest that this documentation has been prepared under the direction and in the presence of Jonnie Kind, MD. Electronically Signed: Margit Banda, Medical Scribe. 12/08/17. 11:33 AM.  I personally performed the services described in this documentation, which was SCRIBED in my presence. The recorded information has been reviewed and considered accurate. It has been edited as necessary during review. Jonnie Kind, MD

## 2017-12-08 NOTE — Patient Instructions (Signed)

## 2018-02-11 ENCOUNTER — Ambulatory Visit: Payer: BLUE CROSS/BLUE SHIELD | Admitting: Family Medicine

## 2018-03-07 ENCOUNTER — Ambulatory Visit: Payer: BLUE CROSS/BLUE SHIELD | Admitting: Obstetrics and Gynecology

## 2018-03-09 ENCOUNTER — Ambulatory Visit: Payer: BLUE CROSS/BLUE SHIELD | Admitting: Obstetrics and Gynecology

## 2018-05-23 IMAGING — DX DG CHEST 2V
2 series · 2 of 2 positions shown · non-contrast
Comparison: 03/23/2016

CLINICAL DATA: Left chest pain beginning last night.

EXAM:
CHEST  2 VIEW

[chest pa]
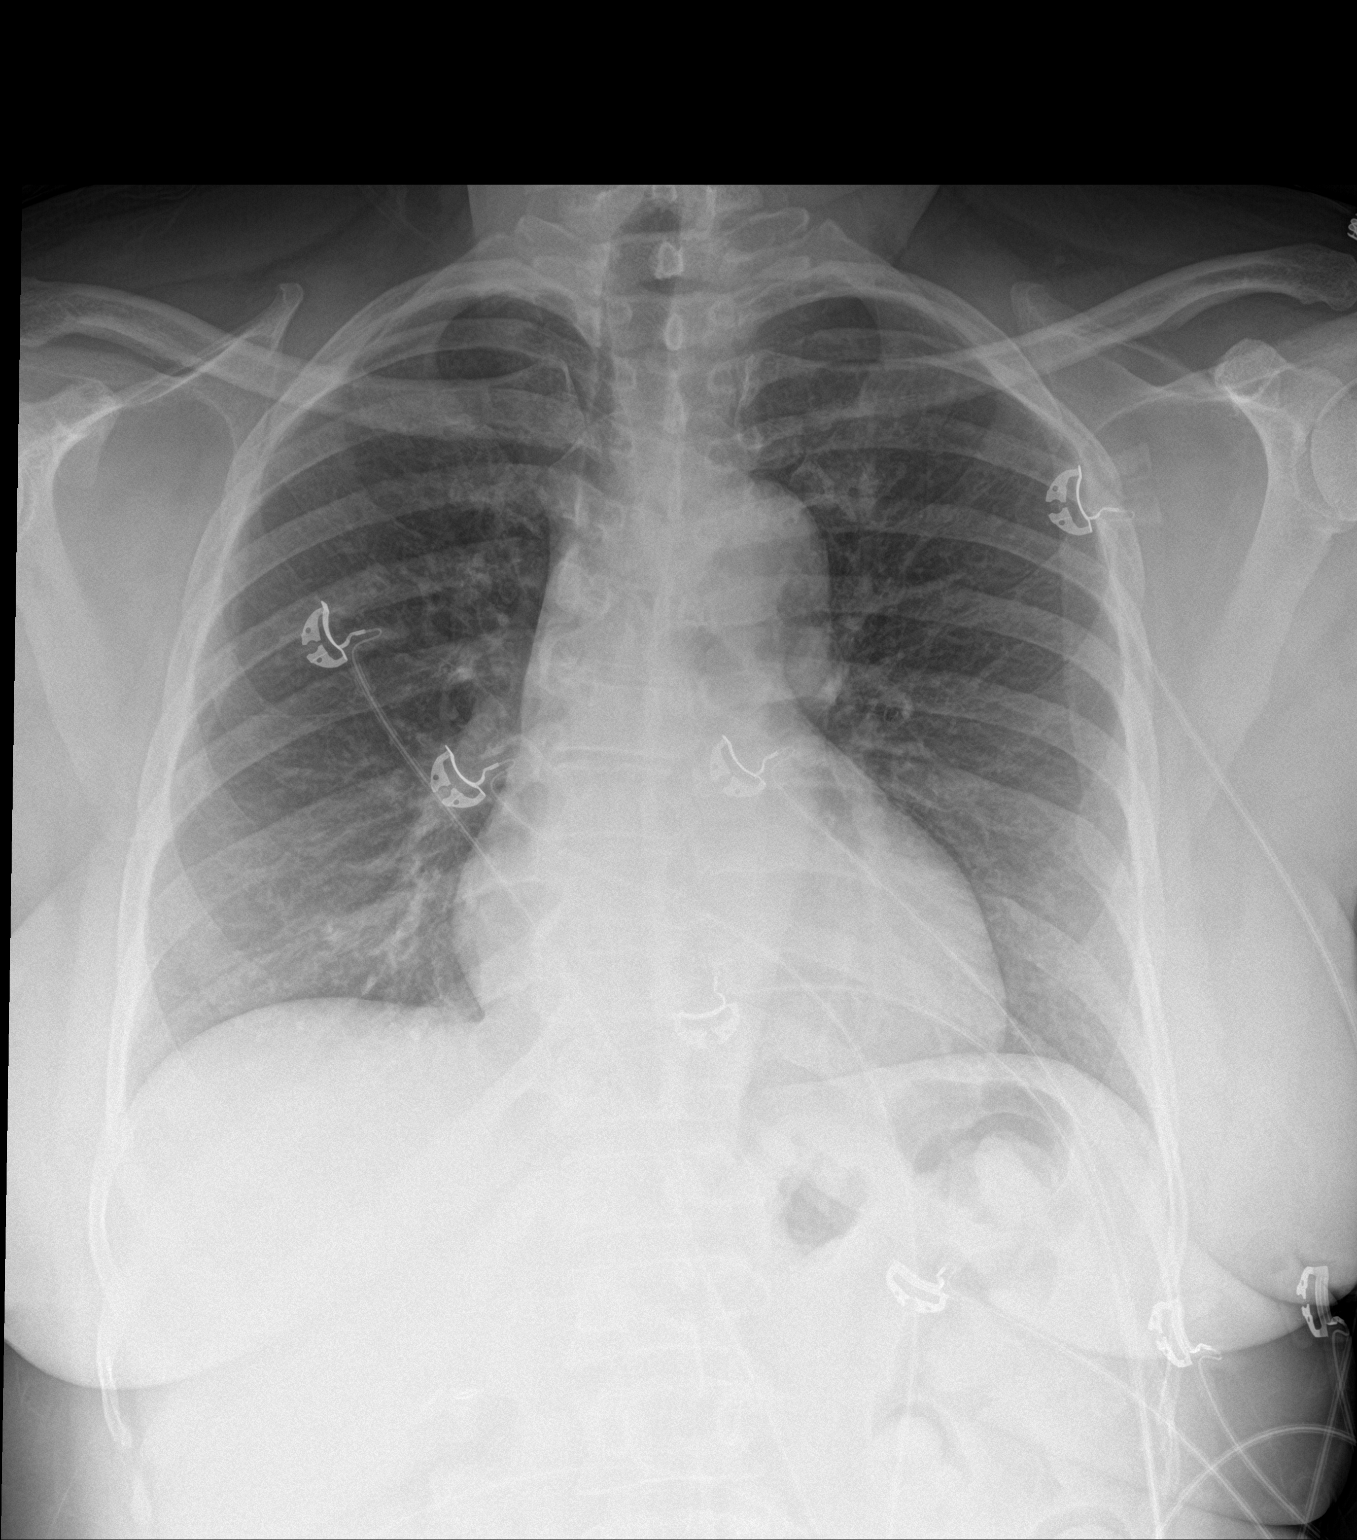

[chest lat]
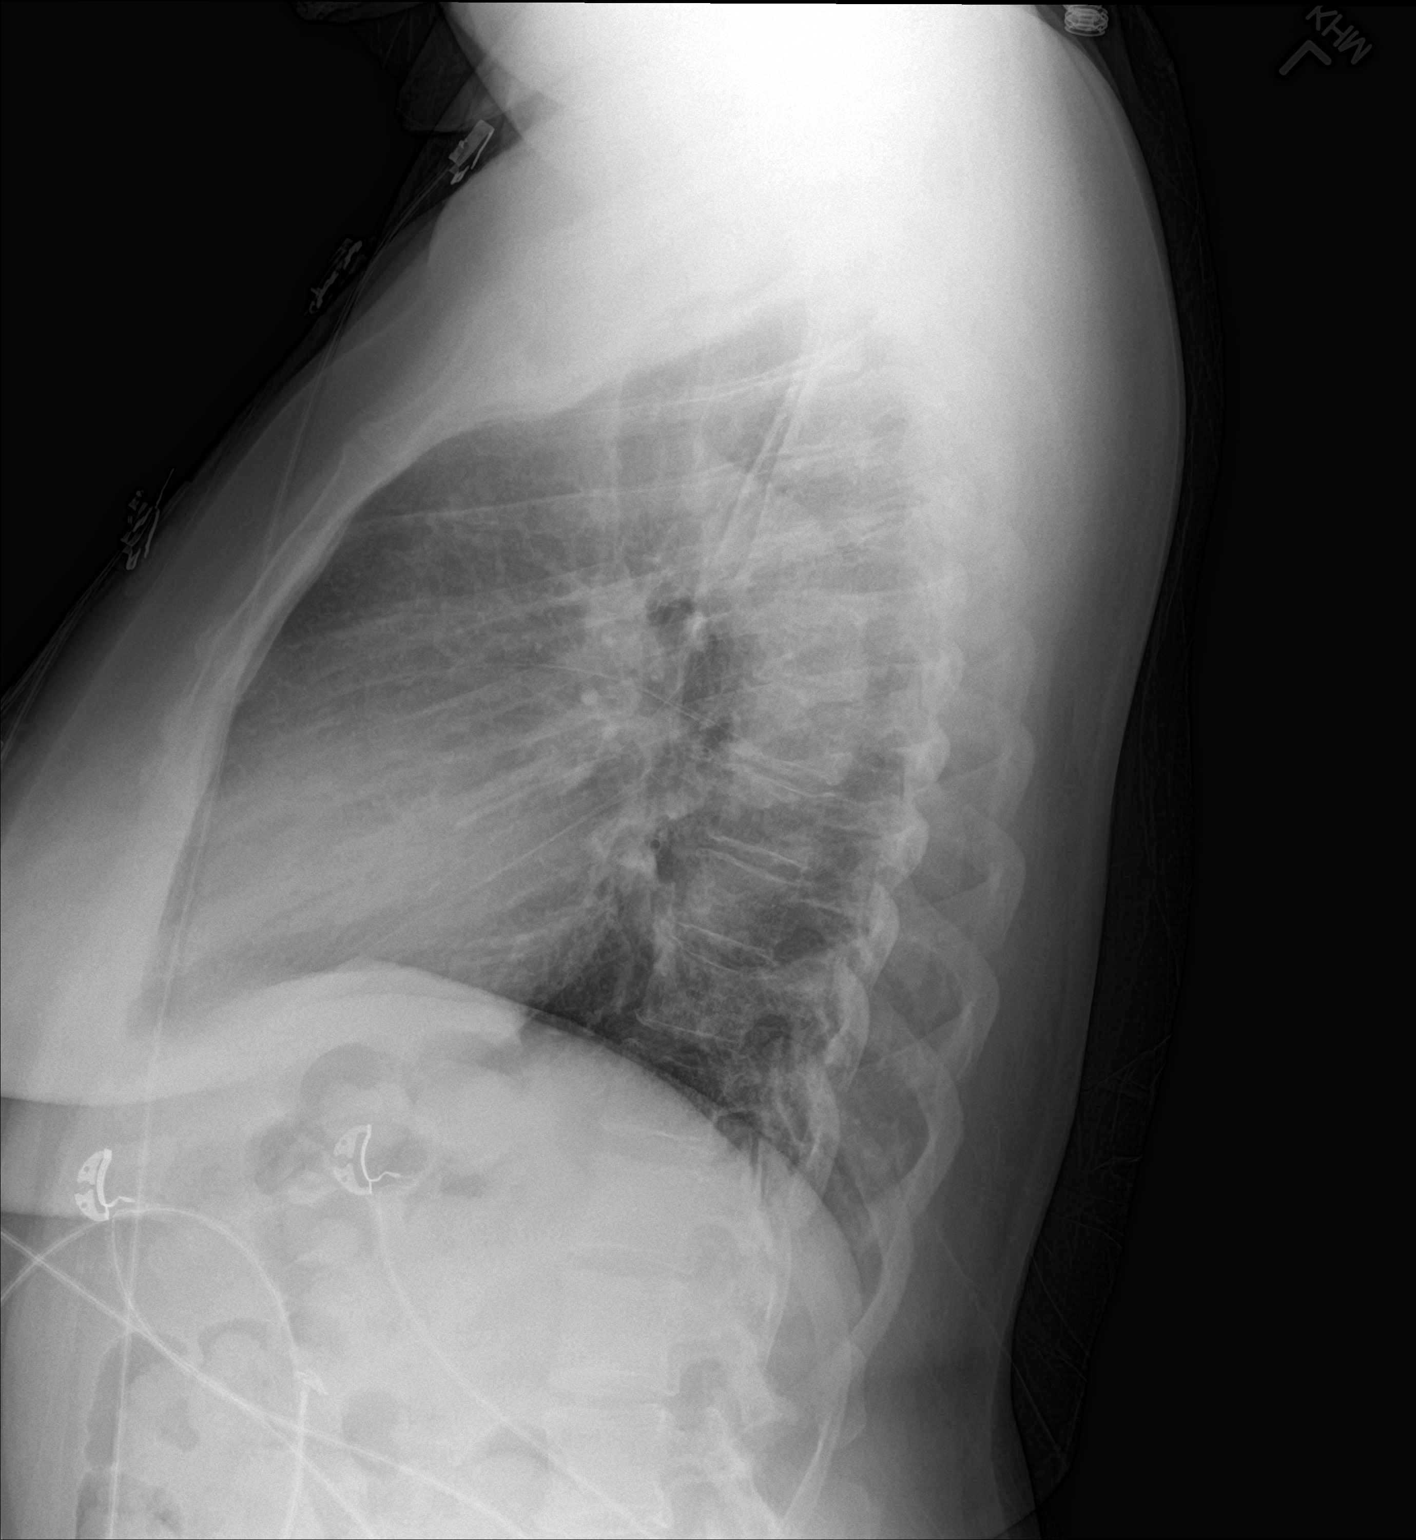

[2 of 2 positions shown; findings below may reference images not displayed]

FINDINGS: Heart is borderline in size. Lungs are clear. No effusions. No acute
bony abnormality. No change.
IMPRESSION: No active cardiopulmonary disease.

## 2018-08-09 ENCOUNTER — Telehealth: Payer: Self-pay | Admitting: *Deleted

## 2018-08-09 ENCOUNTER — Emergency Department (HOSPITAL_COMMUNITY): Payer: Self-pay

## 2018-08-09 ENCOUNTER — Emergency Department (HOSPITAL_COMMUNITY)
Admission: EM | Admit: 2018-08-09 | Discharge: 2018-08-10 | Disposition: A | Discharge status: Home / Self Care | Payer: Self-pay | Attending: Emergency Medicine | Admitting: Emergency Medicine

## 2018-08-09 ENCOUNTER — Other Ambulatory Visit: Payer: Self-pay

## 2018-08-09 ENCOUNTER — Encounter (HOSPITAL_COMMUNITY): Payer: Self-pay | Admitting: Emergency Medicine

## 2018-08-09 DIAGNOSIS — I671 Cerebral aneurysm, nonruptured: Secondary | ICD-10-CM | POA: Insufficient documentation

## 2018-08-09 DIAGNOSIS — F17210 Nicotine dependence, cigarettes, uncomplicated: Secondary | ICD-10-CM | POA: Insufficient documentation

## 2018-08-09 DIAGNOSIS — E119 Type 2 diabetes mellitus without complications: Secondary | ICD-10-CM | POA: Insufficient documentation

## 2018-08-09 DIAGNOSIS — Z7984 Long term (current) use of oral hypoglycemic drugs: Secondary | ICD-10-CM | POA: Insufficient documentation

## 2018-08-09 DIAGNOSIS — Z79899 Other long term (current) drug therapy: Secondary | ICD-10-CM | POA: Insufficient documentation

## 2018-08-09 DIAGNOSIS — I10 Essential (primary) hypertension: Secondary | ICD-10-CM | POA: Insufficient documentation

## 2018-08-09 LAB — I-STAT CHEM 8, ED
BUN: 9 mg/dL (ref 6–20)
Calcium, Ion: 0.88 mmol/L — CL (ref 1.15–1.40)
Chloride: 114 mmol/L — ABNORMAL HIGH (ref 98–111)
Creatinine, Ser: 0.4 mg/dL — ABNORMAL LOW (ref 0.44–1.00)
Glucose, Bld: 121 mg/dL — ABNORMAL HIGH (ref 70–99)
HCT: 29 % — ABNORMAL LOW (ref 36.0–46.0)
Hemoglobin: 9.9 g/dL — ABNORMAL LOW (ref 12.0–15.0)
Potassium: 3.5 mmol/L (ref 3.5–5.1)
Sodium: 141 mmol/L (ref 135–145)
TCO2: 21 mmol/L — ABNORMAL LOW (ref 22–32)

## 2018-08-09 MED ORDER — METOPROLOL TARTRATE 5 MG/5ML IV SOLN
5.0000 mg | Freq: Once | INTRAVENOUS | Status: AC
  Administered 2018-08-09: 5 mg via INTRAVENOUS
  Filled 2018-08-09: qty 5

## 2018-08-09 MED ORDER — HYDROCODONE-ACETAMINOPHEN 5-325 MG PO TABS
2.0000 | ORAL_TABLET | Freq: Once | ORAL | Status: AC
  Administered 2018-08-09: 2 via ORAL
  Filled 2018-08-09: qty 2

## 2018-08-09 MED ORDER — LISINOPRIL 10 MG PO TABS: 10.0000 mg | ORAL_TABLET | Freq: Every day | ORAL | 5 refills | Status: DC

## 2018-08-09 MED ORDER — METOPROLOL TARTRATE 50 MG PO TABS: 50.0000 mg | ORAL_TABLET | Freq: Two times a day (BID) | ORAL | 1 refills | Status: DC

## 2018-08-09 MED ORDER — HYDROCODONE-ACETAMINOPHEN 5-325 MG PO TABS: 1.0000 | ORAL_TABLET | Freq: Four times a day (QID) | ORAL | 0 refills | Status: DC | PRN

## 2018-08-09 MED ORDER — IOPAMIDOL (ISOVUE-370) INJECTION 76%
100.0000 mL | Freq: Once | INTRAVENOUS | Status: AC | PRN
  Administered 2018-08-09: 75 mL via INTRAVENOUS

## 2018-08-09 NOTE — ED Triage Notes (Addendum)
PT c/o headache x2 weeks with dizziness upon movement that started x2 days ago. PT states her PCP wanted her to come to ED for eval due to hx of aneurysm. PT then adds that she has had a new productive cough and nasal congestion over the past few weeks.

## 2018-08-09 NOTE — Discharge Instructions (Signed)
Your blood pressure has been elevated, this is likely related to stress, anxiety and not taking her medications regularly.  Additionally blood pressure may just go up over time and require increased medications.  I have refilled your home lisinopril and have added a new medication called metoprolol, take this twice a day, take your blood pressure once a day at the same time every day approximately 2 hours after you take your morning medicine. Follow-up with your family doctor for recheck of your blood pressure in the next week Call the phone number for the neurosurgeons above to arrange outpatient management of your aneurysm, you should be seen within the next 2 weeks Return to the emergency department immediately for any severe or worsening symptoms including changes in vision, speech, balance, weakness, numbness or worsening headache.  Call 911 if this happens.

## 2018-08-09 NOTE — Telephone Encounter (Signed)
Pt calling having headache and dizziness for 2 weeks and history of aneurysm. Pt advised to go to ED today. Pt states she will go to ED.

## 2018-08-09 NOTE — ED Provider Notes (Signed)
[facility] EMERGENCY DEPARTMENT Provider Note   CSN: [id number] Arrival date & time: 08/09/18  1616     History   Chief Complaint Chief Complaint  Patient presents with  . Headache    HPI Denise Hill is a 57 y.o. female.  HPI  The patient is a very pleasant 57 year old female, she presents to the hospital today with a complaint of a headache.  She reports to me that she is a diabetic taking oral medications, she also has high blood pressure, she is compliant with her medicines but does not check her blood sugars regularly.  She states that over the last 2 weeks that she has had some recurrent headaches on the right side.  She is concerned about this because of her history of a cerebral aneurysm which required clipping in the year 2002 at [facility].  She reports that since that time she has done very well though she has had chronic headaches which she states occur most days.  She reports that she usually just lives with them however 2 weeks ago they became acutely worse, intermittent, sharp and stabbing, associated with intermittent blurred vision.  At this time the patient states she has no headache, no blurred vision and no other symptoms.  She does also report that she gets some intermittent dizziness which is poorly described but states that it feels like all of a sudden there is a jolt where she feels lightheaded, she does not feel that at this time either.  For the last 2 weeks while she has been having symptoms she has been driving her car without difficulty, working at her job without difficulty, has not had any vomiting, diarrhea, fevers or chills.  She has recently developed a mild runny nose sore throat and a cough which is getting better.  She reports that she called her doctor's office today and was told to come to the emergency department for evaluation for possible aneurysm.  Past Medical History:  Diagnosis Date  . Aneurysm ([place]) 2002   coil / clip  [facility]  . Chronic headaches   . Diabetes mellitus without complication ([place])   . Hypercholesteremia   . Hypertension     Patient Active Problem List   Diagnosis Date Noted  . Post-menopausal bleeding 10/22/2017  . Vitamin D deficiency 10/21/2017  . Type 2 diabetes mellitus ([place]) 07/20/2013  . Hyperlipidemia 07/20/2013  . Osteoarthritis of both knees 07/20/2013  . Essential hypertension, benign 06/14/2013    Past Surgical History:  Procedure Laterality Date  . APPENDECTOMY  1980  . CEREBRAL ANEURYSM REPAIR  2002   [name]   Freezing of cervical cells  . CHOLECYSTECTOMY  01/18/2012   Procedure: LAPAROSCOPIC CHOLECYSTECTOMY;  Surgeon: Donato Heinz, MD;  Location: [facility] ORS;  Service: General;  Laterality: N/A;  . COLONOSCOPY N/A 05/03/2014   Procedure: COLONOSCOPY;  Surgeon: Rogene Houston, MD;  Location: [facility] ENDO SUITE;  Service: Endoscopy;  Laterality: N/A;  830  . DILATION AND CURETTAGE OF UTERUS    . SALPINGOOPHORECTOMY  1980   Right side removal  . TUBAL LIGATION  1990;1992     OB History    Gravida  7   Para  4   Term      Preterm      AB  3   Living  4     SAB  1   TAB  2   Ectopic  Multiple      Live Births               Home Medications    Prior to Admission medications   Medication Sig Start Date End Date Taking? Authorizing Provider  atorvastatin (LIPITOR) 20 MG tablet Take 1 tablet (20 mg total) by mouth daily. 09/30/17  Yes Luking, Elayne Snare, MD  glipiZIDE (GLUCOTROL) 5 MG tablet Take 1/2 tablet by mouth twice daily Patient taking differently: Take 2.5 mg by mouth daily.  09/30/17  Yes Kathyrn Drown, MD  metFORMIN (GLUCOPHAGE) 500 MG tablet Take 1/2 tablet by mouth two times daily Patient taking differently: Take 250 mg by mouth daily.  09/30/17  Yes Kathyrn Drown, MD  glucose blood (CONTOUR NEXT TEST) test strip Tests sugar once a day- E11.9 06/02/17   Kathyrn Drown, MD  lisinopril  (PRINIVIL,ZESTRIL) 10 MG tablet Take 1 tablet (10 mg total) by mouth daily. 08/09/18   Noemi Chapel, MD  metoprolol tartrate (LOPRESSOR) 50 MG tablet Take 1 tablet (50 mg total) by mouth 2 (two) times daily. 08/09/18   Noemi Chapel, MD    Family History Family History  Problem Relation Age of Onset  . Hypertension Mother   . Diabetes Mother   . Hyperlipidemia Mother   . Hypertension Sister   . Hypertension Sister   . Colon cancer Neg Hx     Social History Social History   Tobacco Use  . Smoking status: Current Every Day Smoker    Packs/day: 0.50    Years: 38.00    Pack years: 19.00    Types: Cigarettes  . Smokeless tobacco: Never Used  Substance Use Topics  . Alcohol use: No  . Drug use: No     Allergies   Pravastatin   Review of Systems Review of Systems  All other systems reviewed and are negative.    Physical Exam Updated Vital Signs BP (!) 180/86 (BP Location: Left Arm)   Pulse 80   Temp 97.9 F (36.6 C) (Oral)   Resp 18   Ht 1.715 m (5' 7.5")   Wt 99.8 kg   LMP 01/15/2012   SpO2 99%   BMI 33.95 kg/m   Physical Exam  Constitutional: She appears well-developed and well-nourished. No distress.  HENT:  Head: Normocephalic and atraumatic.  Mouth/Throat: Oropharynx is clear and moist. No oropharyngeal exudate.  Atraumatic head, no signs of swelling tenderness over the temporal artery or abnormal appearance.  She has no trismus or torticollis, oropharynx is clear and moist  Eyes: Pupils are equal, round, and reactive to light. Conjunctivae and EOM are normal. Right eye exhibits no discharge. Left eye exhibits no discharge. No scleral icterus.  Neck: Normal range of motion. Neck supple. No JVD present. No thyromegaly present.  Cardiovascular: Normal rate, regular rhythm, normal heart sounds and intact distal pulses. Exam reveals no gallop and no friction rub.  No murmur heard. Pulmonary/Chest: Effort normal and breath sounds normal. No respiratory  distress. She has no wheezes. She has no rales.  Abdominal: Soft. Bowel sounds are normal. She exhibits no distension and no mass. There is no tenderness.  Musculoskeletal: Normal range of motion. She exhibits no edema or tenderness.  Lymphadenopathy:    She has no cervical adenopathy.  Neurological: She is alert. Coordination normal.  The patient has a normal gait, normal finger-nose-finger, normal speech and cranial nerves III through XII, her visual acuity is grossly normal, her strength is normal in all 4 extremities.  Her coordination is normal, her memory is intact, she answers questions appropriately and has normal coordination with limbs as well as trunk in the sitting position.  Skin: Skin is warm and dry. No rash noted. No erythema.  Psychiatric: She has a normal mood and affect. Her behavior is normal.  Nursing note and vitals reviewed.    ED Treatments / Results  Labs (all labs ordered are listed, but only abnormal results are displayed) Labs Reviewed  I-STAT CHEM 8, ED - Abnormal; Notable for the following components:      Result Value   Chloride 114 (*)    Creatinine, Ser 0.40 (*)    Glucose, Bld 121 (*)    Calcium, Ion 0.88 (*)    TCO2 21 (*)    Hemoglobin 9.9 (*)    HCT 29.0 (*)    All other components within normal limits    EKG None  Radiology Ct Angio Head W Or Wo Contrast  Result Date: 08/09/2018 CLINICAL DATA:  57 y/o F; history of aneurysm clipping. Severe right-sided headache. EXAM: CT ANGIOGRAPHY HEAD AND NECK TECHNIQUE: Multidetector CT imaging of the head and neck was performed using the standard protocol during bolus administration of intravenous contrast. Multiplanar CT image reconstructions and MIPs were obtained to evaluate the vascular anatomy. Carotid stenosis measurements (when applicable) are obtained utilizing NASCET criteria, using the distal internal carotid diameter as the denominator. CONTRAST:  29mL ISOVUE-370 IOPAMIDOL (ISOVUE-370) INJECTION  76% COMPARISON:  07/21/2011 CT head. FINDINGS: CT HEAD FINDINGS Brain: No evidence of acute infarction, hemorrhage, hydrocephalus, extra-axial collection or mass lesion/mass effect. Stable small focus of encephalomalacia within the right paramedian frontal lobe. Vascular: As below. Skull: Chronic postsurgical changes related to bifrontal craniotomy. Sinuses: Mild mucosal thickening of the paranasal sinuses. Normal aeration of the mastoid air cells. Orbits are unremarkable. Orbits: No acute finding. CTA NECK FINDINGS Aortic arch: Aberrant right subclavian artery. Imaged portion shows no evidence of aneurysm or dissection. No significant stenosis of the major arch vessel origins. Mild aortic calcific atherosclerosis. Right carotid system: No evidence of dissection, stenosis (50% or greater) or occlusion. Left carotid system: No evidence of dissection, stenosis (50% or greater) or occlusion. Mild non stenotic calcific atherosclerosis of the carotid bifurcation. Vertebral arteries: Left dominant. No evidence of dissection, stenosis (50% or greater) or occlusion. Skeleton: Mild cervical spondylosis. No high-grade bony canal stenosis. Other neck: Negative. Upper chest: Negative. Review of the MIP images confirms the above findings CTA HEAD FINDINGS Anterior circulation: Azygos proximal anterior cerebral artery. Multiple pericallosal aneurysm clips in the anterior cerebral artery distribution. Saccular aneurysm measuring 10 x 8 x 6 mm (AP x ML x CC series 12, image 81 and series 13, image 98) at the inferior margin of the aneurysm clips arising from the azygos anterior cerebral artery. No large vessel occlusion, additional aneurysm, high-grade stenosis, or vascular malformation identified. Posterior circulation: No significant stenosis, proximal occlusion, aneurysm, or vascular malformation. Venous sinuses: As permitted by contrast timing, patent. Anatomic variants: Azygos A2. Complete circle-of-Willis. Fetal left PCA.  Delayed phase: No abnormal intracranial enhancement. Review of the MIP images confirms the above findings IMPRESSION: CT head: 1. No acute intracranial abnormality identified. No abnormal enhancement. 2. Stable small focus of encephalomalacia in right paramedian frontal lobe. 3. Mild paranasal sinus disease. CTA NECK: 1. Patent carotid and vertebral arteries of the neck. No dissection, aneurysm, or hemodynamically significant stenosis utilizing NASCET criteria. 2. Aberrant right subclavian artery. CTA HEAD: 1. Residual/recurrent saccular aneurysm measuring up to 10 mm  at the inferior margin of aneurysm clips arising from azygos anterior cerebral artery. No large vessel occlusion, aneurysm, or high-grade stenosis is identified. 2. Otherwise unremarkable CTA of the head. These results were called by telephone at the time of interpretation on 08/09/2018 at 8:55 pm to Dr. Noemi Chapel , who verbally acknowledged these results. Electronically Signed   By: Kristine Garbe M.D.   On: 08/09/2018 20:55   Ct Angio Neck W Or Wo Contrast  Result Date: 08/09/2018 CLINICAL DATA:  57 y/o F; history of aneurysm clipping. Severe right-sided headache. EXAM: CT ANGIOGRAPHY HEAD AND NECK TECHNIQUE: Multidetector CT imaging of the head and neck was performed using the standard protocol during bolus administration of intravenous contrast. Multiplanar CT image reconstructions and MIPs were obtained to evaluate the vascular anatomy. Carotid stenosis measurements (when applicable) are obtained utilizing NASCET criteria, using the distal internal carotid diameter as the denominator. CONTRAST:  82mL ISOVUE-370 IOPAMIDOL (ISOVUE-370) INJECTION 76% COMPARISON:  07/21/2011 CT head. FINDINGS: CT HEAD FINDINGS Brain: No evidence of acute infarction, hemorrhage, hydrocephalus, extra-axial collection or mass lesion/mass effect. Stable small focus of encephalomalacia within the right paramedian frontal lobe. Vascular: As below. Skull:  Chronic postsurgical changes related to bifrontal craniotomy. Sinuses: Mild mucosal thickening of the paranasal sinuses. Normal aeration of the mastoid air cells. Orbits are unremarkable. Orbits: No acute finding. CTA NECK FINDINGS Aortic arch: Aberrant right subclavian artery. Imaged portion shows no evidence of aneurysm or dissection. No significant stenosis of the major arch vessel origins. Mild aortic calcific atherosclerosis. Right carotid system: No evidence of dissection, stenosis (50% or greater) or occlusion. Left carotid system: No evidence of dissection, stenosis (50% or greater) or occlusion. Mild non stenotic calcific atherosclerosis of the carotid bifurcation. Vertebral arteries: Left dominant. No evidence of dissection, stenosis (50% or greater) or occlusion. Skeleton: Mild cervical spondylosis. No high-grade bony canal stenosis. Other neck: Negative. Upper chest: Negative. Review of the MIP images confirms the above findings CTA HEAD FINDINGS Anterior circulation: Azygos proximal anterior cerebral artery. Multiple pericallosal aneurysm clips in the anterior cerebral artery distribution. Saccular aneurysm measuring 10 x 8 x 6 mm (AP x ML x CC series 12, image 81 and series 13, image 98) at the inferior margin of the aneurysm clips arising from the azygos anterior cerebral artery. No large vessel occlusion, additional aneurysm, high-grade stenosis, or vascular malformation identified. Posterior circulation: No significant stenosis, proximal occlusion, aneurysm, or vascular malformation. Venous sinuses: As permitted by contrast timing, patent. Anatomic variants: Azygos A2. Complete circle-of-Willis. Fetal left PCA. Delayed phase: No abnormal intracranial enhancement. Review of the MIP images confirms the above findings IMPRESSION: CT head: 1. No acute intracranial abnormality identified. No abnormal enhancement. 2. Stable small focus of encephalomalacia in right paramedian frontal lobe. 3. Mild  paranasal sinus disease. CTA NECK: 1. Patent carotid and vertebral arteries of the neck. No dissection, aneurysm, or hemodynamically significant stenosis utilizing NASCET criteria. 2. Aberrant right subclavian artery. CTA HEAD: 1. Residual/recurrent saccular aneurysm measuring up to 10 mm at the inferior margin of aneurysm clips arising from azygos anterior cerebral artery. No large vessel occlusion, aneurysm, or high-grade stenosis is identified. 2. Otherwise unremarkable CTA of the head. These results were called by telephone at the time of interpretation on 08/09/2018 at 8:55 pm to Dr. Noemi Chapel , who verbally acknowledged these results. Electronically Signed   By: Kristine Garbe M.D.   On: 08/09/2018 20:55    Procedures Procedures (including critical care time)  Medications Ordered in ED Medications  iopamidol (ISOVUE-370) 76 % injection 100 mL (75 mLs Intravenous Contrast Given 08/09/18 1844)  HYDROcodone-acetaminophen (NORCO/VICODIN) 5-325 MG per tablet 2 tablet (2 tablets Oral Given 08/09/18 2304)  metoprolol tartrate (LOPRESSOR) injection 5 mg (5 mg Intravenous Given 08/09/18 2305)     Initial Impression / Assessment and Plan / ED Course  I have reviewed the triage vital signs and the nursing notes.  Pertinent labs & imaging results that were available during my care of the patient were reviewed by me and considered in my medical decision making (see chart for details).  Clinical Course as of Aug 09 2338  Tue Aug 09, 2018  2308 The case was discussed with Dr. Gladys Damme of the [facility] neurosurgical service who recommends that the patient should be seen within the next couple of weeks in the outpatient clinic and that this will likely need to have further treatment.  They have given me the phone number for the clinic.   [BM]  2308 Of note the patient's blood pressure has been elevated throughout her stay, it is currently 180/86.  She states that it usually runs  around that range.  She is taking her medications at home but not always taking them every day.  She was given 5 mg of Lopressor   [BM]  2309 Hemoglobin is slightly lower than a year ago, renal function preserved, glucose 121   [BM]    Clinical Course User Index [BM] Noemi Chapel, MD   The patient is very concerned about a recurrent aneurysm.  At this time I think it would be reasonable to do a CT angiogram to rule that out however the correlation with headaches which are intermittent in the presence of an aneurysm is not absolute.  The patient was made aware of this, if she does have an aneurysm which has recurred she will need referral back to neurosurgery though at this time unless she has a very large aneurysm she will likely be discharged home.  I have discussed the case with Dr. Tivis Ringer - I have discussed the case with the patient including the recommendations of the NS.  She is agreeable - she has minimal headache after medications, she reports that she has not been as compliant with her blood pressure medications and does need a refill on her home lisinopril.  I will also start her on a low-dose metoprolol, she is agreeable to this plan.  She will call the office tomorrow morning to arrange for follow-up and is aware of the reasons to return.  Her repeat neurologic exam is totally normal, mental status is clear.  Final Clinical Impressions(s) / ED Diagnoses   Final diagnoses:  Essential hypertension  Brain aneurysm    ED Discharge Orders         Ordered    lisinopril (PRINIVIL,ZESTRIL) 10 MG tablet  Daily     08/09/18 2337    metoprolol tartrate (LOPRESSOR) 50 MG tablet  2 times daily     08/09/18 2337           Noemi Chapel, MD 08/09/18 [phone]

## 2018-08-09 NOTE — Telephone Encounter (Signed)
I agree with this  

## 2018-08-10 ENCOUNTER — Telehealth: Payer: Self-pay | Admitting: Family Medicine

## 2018-08-10 DIAGNOSIS — I671 Cerebral aneurysm, nonruptured: Secondary | ICD-10-CM

## 2018-08-10 NOTE — Telephone Encounter (Signed)
Referral ordered in Epic. Left message to return call to notify patient. 

## 2018-08-10 NOTE — Telephone Encounter (Signed)
ER recommended referral  back to neurology for brain aneurysm (patient has a history of aneurysm repair) but needs formal referral.  please advise

## 2018-08-10 NOTE — Telephone Encounter (Signed)
Please go ahead with referral but please explained to the family that this typically can take a little bit of time for a specialist to get them in so therefore if she is having any ongoing troubles she ought to do a follow-up in the office  Obviously if any emergency issues to go to the ER

## 2018-08-10 NOTE — Telephone Encounter (Signed)
Patient went to ER yesterday for headaches,dizziness and was told to get a referral to [place] a neurologist.She wants to know if appointment is needed here first or can we send a referral to [facility].

## 2018-08-10 NOTE — Telephone Encounter (Signed)
Pt called back, explained that we are referring to neurology Pt prefers to go back to [place] in referral, explained to pt once appt is scheduled we would call her with information, pt verbalized understanding

## 2018-08-12 ENCOUNTER — Encounter: Payer: Self-pay | Admitting: Family Medicine

## 2018-09-02 DIAGNOSIS — I671 Cerebral aneurysm, nonruptured: Secondary | ICD-10-CM | POA: Diagnosis not present

## 2018-09-13 DIAGNOSIS — R930 Abnormal findings on diagnostic imaging of skull and head, not elsewhere classified: Secondary | ICD-10-CM | POA: Diagnosis not present

## 2018-09-13 DIAGNOSIS — I671 Cerebral aneurysm, nonruptured: Secondary | ICD-10-CM | POA: Diagnosis not present

## 2018-09-13 DIAGNOSIS — E119 Type 2 diabetes mellitus without complications: Secondary | ICD-10-CM | POA: Diagnosis not present

## 2018-09-13 DIAGNOSIS — F17210 Nicotine dependence, cigarettes, uncomplicated: Secondary | ICD-10-CM | POA: Diagnosis not present

## 2018-09-13 DIAGNOSIS — I10 Essential (primary) hypertension: Secondary | ICD-10-CM | POA: Diagnosis not present

## 2018-09-16 ENCOUNTER — Other Ambulatory Visit: Payer: Self-pay | Admitting: *Deleted

## 2018-09-16 ENCOUNTER — Ambulatory Visit: Payer: BLUE CROSS/BLUE SHIELD | Admitting: Family Medicine

## 2018-09-16 ENCOUNTER — Encounter: Payer: Self-pay | Admitting: Family Medicine

## 2018-09-16 VITALS — BP 166/104 | Temp 98.3°F | Ht 67.5 in | Wt 224.4 lb

## 2018-09-16 DIAGNOSIS — Z79899 Other long term (current) drug therapy: Secondary | ICD-10-CM

## 2018-09-16 DIAGNOSIS — E119 Type 2 diabetes mellitus without complications: Secondary | ICD-10-CM | POA: Diagnosis not present

## 2018-09-16 DIAGNOSIS — Z720 Tobacco use: Secondary | ICD-10-CM

## 2018-09-16 DIAGNOSIS — E785 Hyperlipidemia, unspecified: Secondary | ICD-10-CM | POA: Diagnosis not present

## 2018-09-16 DIAGNOSIS — J019 Acute sinusitis, unspecified: Secondary | ICD-10-CM | POA: Diagnosis not present

## 2018-09-16 DIAGNOSIS — I10 Essential (primary) hypertension: Secondary | ICD-10-CM

## 2018-09-16 MED ORDER — LISINOPRIL 20 MG PO TABS: 20.0000 mg | ORAL_TABLET | Freq: Every day | ORAL | 5 refills | Status: DC

## 2018-09-16 MED ORDER — ATORVASTATIN CALCIUM 20 MG PO TABS: 20.0000 mg | ORAL_TABLET | Freq: Every day | ORAL | 1 refills | Status: DC

## 2018-09-16 MED ORDER — GLIPIZIDE 5 MG PO TABS: ORAL_TABLET | ORAL | 5 refills | Status: DC

## 2018-09-16 MED ORDER — VARENICLINE TARTRATE 1 MG PO TABS: 1.0000 mg | ORAL_TABLET | Freq: Two times a day (BID) | ORAL | 1 refills | Status: DC

## 2018-09-16 MED ORDER — AMOXICILLIN 500 MG PO CAPS: 500.0000 mg | ORAL_CAPSULE | Freq: Three times a day (TID) | ORAL | 0 refills | Status: AC

## 2018-09-16 MED ORDER — VARENICLINE TARTRATE 0.5 MG X 11 & 1 MG X 42 PO MISC: ORAL | 0 refills | Status: DC

## 2018-09-16 MED ORDER — METFORMIN HCL 500 MG PO TABS: ORAL_TABLET | ORAL | 5 refills | Status: DC

## 2018-09-16 MED ORDER — METOPROLOL TARTRATE 50 MG PO TABS: 50.0000 mg | ORAL_TABLET | Freq: Two times a day (BID) | ORAL | 5 refills | Status: DC

## 2018-09-16 MED ORDER — AMLODIPINE BESYLATE 5 MG PO TABS: 5.0000 mg | ORAL_TABLET | Freq: Every day | ORAL | 5 refills | Status: DC

## 2018-09-16 NOTE — Progress Notes (Signed)
   Subjective:    Patient ID: Denise Hill, female    DOB: 1961/03/29, 58 y.o.   MRN: [id number]  Hypertension  This is a chronic problem. Associated symptoms include headaches. Pertinent negatives include no chest pain or shortness of breath. Treatments tried: lisinopril, metoprolol. Compliance problems include exercise and diet.    [facility] in [facility] on 08/09/[facility] for headache and dizziness and  found aneurysm. Pt states bp has been elevated since then. 160-200/100s. Has been taking metoprolol bid, reports also taking lisinopril bid. Reports intermittent daily h/a. Typically h/a on right side. Reports blurred vision and dizziness sometimes with these. Reports some nausea but no vomiting. Has appt next week with neurosurgeon.  Would like rx for chantix to quit smoking. Had chantix previously and quit for 1.5 years previously.   Cough and congestion for 2 -3 weeks, cough productive of white/yellow sputum. No fever. No ear pain. Slight sore throat. Feels like it's getting better but just can't get rid of the cough and congestion.     Review of Systems  Constitutional: Negative for fever.  HENT: Positive for congestion. Negative for ear pain.   Eyes: Positive for visual disturbance.  Respiratory: Positive for cough. Negative for shortness of breath and wheezing.   Cardiovascular: Negative for chest pain.  Gastrointestinal: Positive for nausea.  Neurological: Positive for dizziness and headaches.       Objective:   Physical Exam Vitals signs and nursing note reviewed.  Constitutional:      General: She is not in acute distress.    Appearance: Normal appearance. She is not toxic-appearing.  HENT:     Head: Normocephalic and atraumatic.     Nose: Congestion present.  Neck:     Musculoskeletal: Neck supple. No neck rigidity.  Cardiovascular:     Rate and Rhythm: Normal rate and regular rhythm.     Heart sounds: Normal heart sounds.  Pulmonary:     Effort: Pulmonary effort is  normal. No respiratory distress.     Breath sounds: Normal breath sounds.  Musculoskeletal:     Right lower leg: No edema.     Left lower leg: No edema.  Lymphadenopathy:     Cervical: No cervical adenopathy.  Skin:    General: Skin is warm and dry.  Neurological:     General: No focal deficit present.     Mental Status: She is alert and oriented to person, place, and time.     Motor: No weakness.     Coordination: Coordination normal.           Assessment & Plan:  1. Essential hypertension Blood pressure not under good control.  We will add amlodipine 5 mg.  Continue with lisinopril 20 mg daily and metoprolol 50 mg twice daily.  Patient to keep track of blood pressures at home.  We will follow-up in 1 week to recheck.  Would like blood pressures under better control before patient undergoes surgery for aneurysms.  2. Acute rhinosinusitis Likely post viral sinusitis.  Will treat with antibiotics.  Warning signs discussed.  Symptomatic care discussed.  Follow-up if symptoms worsen or fail to improve.  3. Smoking trying to quit Patient has tried Chantix in the past with success and no adverse effects.  Would like to start again.  Feel this is reasonable, will prescribe.  Warning signs discussed.  Dr. Sallee Lange was consulted on this case and is in agreement with the above treatment plan.

## 2018-09-17 LAB — HEPATIC FUNCTION PANEL
ALT: 8 IU/L (ref 0–32)
AST: 12 IU/L (ref 0–40)
Albumin: 4.2 g/dL (ref 3.5–5.5)
Alkaline Phosphatase: 84 IU/L (ref 39–117)
Bilirubin Total: 0.3 mg/dL (ref 0.0–1.2)
Bilirubin, Direct: 0.09 mg/dL (ref 0.00–0.40)
Total Protein: 6.9 g/dL (ref 6.0–8.5)

## 2018-09-17 LAB — BASIC METABOLIC PANEL
BUN/Creatinine Ratio: 12 (ref 9–23)
BUN: 8 mg/dL (ref 6–24)
CO2: 23 mmol/L (ref 20–29)
Calcium: 9.5 mg/dL (ref 8.7–10.2)
Chloride: 104 mmol/L (ref 96–106)
Creatinine, Ser: 0.68 mg/dL (ref 0.57–1.00)
GFR calc Af Amer: 114 mL/min/{1.73_m2} (ref >59)
GFR calc non Af Amer: 99 mL/min/{1.73_m2} (ref >59)
Glucose: 178 mg/dL — ABNORMAL HIGH (ref 65–99)
Potassium: 4.5 mmol/L (ref 3.5–5.2)
Sodium: 141 mmol/L (ref 134–144)

## 2018-09-17 LAB — HEMOGLOBIN A1C
Est. average glucose Bld gHb Est-mCnc: 206 mg/dL
Hgb A1c MFr Bld: 8.8 % — ABNORMAL HIGH (ref 4.8–5.6)

## 2018-09-17 LAB — LIPID PANEL
Chol/HDL Ratio: 5.9 ratio — ABNORMAL HIGH (ref 0.0–4.4)
Cholesterol, Total: 254 mg/dL — ABNORMAL HIGH (ref 100–199)
HDL: 43 mg/dL (ref >39)
LDL Calculated: 176 mg/dL — ABNORMAL HIGH (ref 0–99)
Triglycerides: 177 mg/dL — ABNORMAL HIGH (ref 0–149)
VLDL Cholesterol Cal: 35 mg/dL (ref 5–40)

## 2018-09-22 DIAGNOSIS — I671 Cerebral aneurysm, nonruptured: Secondary | ICD-10-CM | POA: Diagnosis not present

## 2018-09-23 ENCOUNTER — Encounter: Payer: Self-pay | Admitting: Family Medicine

## 2018-09-23 ENCOUNTER — Ambulatory Visit: Payer: BLUE CROSS/BLUE SHIELD | Admitting: Family Medicine

## 2018-09-23 VITALS — BP 142/90 | Ht 67.5 in | Wt 222.0 lb

## 2018-09-23 DIAGNOSIS — E785 Hyperlipidemia, unspecified: Secondary | ICD-10-CM | POA: Diagnosis not present

## 2018-09-23 DIAGNOSIS — E1165 Type 2 diabetes mellitus with hyperglycemia: Secondary | ICD-10-CM

## 2018-09-23 DIAGNOSIS — I10 Essential (primary) hypertension: Secondary | ICD-10-CM | POA: Diagnosis not present

## 2018-09-23 MED ORDER — GLIPIZIDE ER 5 MG PO TB24: 5.0000 mg | ORAL_TABLET | Freq: Every day | ORAL | 5 refills | Status: DC

## 2018-09-23 MED ORDER — METFORMIN HCL ER 500 MG PO TB24: 500.0000 mg | ORAL_TABLET | Freq: Every day | ORAL | 5 refills | Status: DC

## 2018-09-23 NOTE — Progress Notes (Signed)
Subjective:    Patient ID: Denise Hill, female    DOB: October 15, 1960, 58 y.o.   MRN: [id number]  HPI Patient is here today to follow up on her blood pressure.She states she has had a problem with her blood pressure for a while.  We added amlodipine 5 mg once per day to her lisinopril 20 mg and her Metoprolol 50 mg bid. She states she has not kept record of her blood pressures,but she has been taking the medication.   She does have a history of aneurysms in her head.  She has not stop smoking as of yet she is taking Chantix and is not smoking as much. Denies any problems with depressed mood or SI.   Her blood pressure today in the office is 152/100 and states she has not taking her medication this am.  Saw neurosurgeon yesterday, surgery possibly scheduled for next Friday. States BP in office yesterday was 150/80s. Not check BP at home but states h/a's have improved.   Had been out of lipitor x 1 month, just restarted medication last week.   Has not checked blood sugars. Taking glipizide 1/2 tablet bid, and metformin 1/2 tab bid. Reports some issue with diarrhea with metformin. Sometimes misses evening dose of meds.      Review of Systems  Constitutional: Negative for fever and unexpected weight change.  HENT: Negative for congestion, ear pain, sinus pressure and sinus pain.   Eyes: Negative for visual disturbance.  Respiratory: Negative for cough and shortness of breath.   Cardiovascular: Negative for chest pain and leg swelling.  Neurological: Negative for facial asymmetry, speech difficulty, weakness, numbness and headaches.  Psychiatric/Behavioral: Negative for dysphoric mood and suicidal ideas.         Objective:   Physical Exam Vitals signs and nursing note reviewed.  Constitutional:      General: She is not in acute distress.    Appearance: She is well-developed.  HENT:     Head: Normocephalic and atraumatic.  Neck:     Musculoskeletal: Neck supple.     Vascular:  No carotid bruit.  Cardiovascular:     Rate and Rhythm: Normal rate and regular rhythm.     Heart sounds: Normal heart sounds. No murmur.  Pulmonary:     Effort: Pulmonary effort is normal. No respiratory distress.     Breath sounds: Normal breath sounds.  Skin:    General: Skin is warm and dry.  Neurological:     Mental Status: She is alert and oriented to person, place, and time.  Psychiatric:        Mood and Affect: Mood normal.           Assessment & Plan:  1. Essential hypertension, benign Blood pressure under better control, still not at goal. Continue with current regimen. Will f/u in 4-6 weeks.  2. Hyperlipidemia, unspecified hyperlipidemia type Pt just restarted medication, will recheck lipids in about 8 weeks to see if improved.  3. Type 2 diabetes mellitus with hyperglycemia, without long-term current use of insulin ([place]) Instructed patient to check blood sugars providing random sample this week and send [place] those numbers. Changed her metformin and glipizide to ER formulations as she was often missing her evening doses. Will see if this helps keep sugars under better control before making any other medication changes. Encouraged healthy diet and exercise. F/u in 4-6 weeks.   Dr. Sallee Lange was consulted on this case and is in agreement with the above treatment plan.

## 2018-09-23 NOTE — Patient Instructions (Signed)
-Continue with your current blood pressure medications -We are changing your diabetes medications: Take 1 metformin ER tablet in the morning and 1 glipizide ER tablet in the morning -Keep track of your blood sugars over the next week and vary the times you check it and send [place] those numbers please  -Goal blood sugars: fasting in the morning between 100-130; before meals <160; before bedtime <180 -Work on [employer], decreasing sodium or salt in the diet, and decreasing sugar and processed foods in your diet   DASH Eating Plan DASH stands for "Dietary Approaches to Stop Hypertension." The DASH eating plan is a healthy eating plan that has been shown to reduce high blood pressure (hypertension). It may also reduce your risk for type 2 diabetes, heart disease, and stroke. The DASH eating plan may also help with weight loss. What are tips for following this plan?  General guidelines  Avoid eating more than 2,300 mg (milligrams) of salt (sodium) a day. If you have hypertension, you may need to reduce your sodium intake to 1,500 mg a day.  Limit alcohol intake to no more than 1 drink a day for nonpregnant women and 2 drinks a day for men. One drink equals 12 oz of beer, 5 oz of wine, or 1 oz of hard liquor.  Work with your health care provider to maintain a healthy body weight or to lose weight. Ask what an ideal weight is for you.  Get at least 30 minutes of exercise that causes your heart to beat faster (aerobic exercise) most days of the week. Activities may include walking, swimming, or biking.  Work with your health care provider or diet and nutrition specialist (dietitian) to adjust your eating plan to your individual calorie needs. Reading food labels   Check food labels for the amount of sodium per serving. Choose foods with less than 5 percent of the Daily Value of sodium. Generally, foods with less than 300 mg of sodium per serving fit into this eating plan.  To find whole grains,  look for the word "whole" as the first word in the ingredient list. Shopping  Buy products labeled as "low-sodium" or "no salt added."  Buy fresh foods. Avoid canned foods and premade or frozen meals. Cooking  Avoid adding salt when cooking. Use salt-free seasonings or herbs instead of table salt or sea salt. Check with your health care provider or pharmacist before using salt substitutes.  Do not fry foods. Cook foods using healthy methods such as baking, boiling, grilling, and broiling instead.  Cook with heart-healthy oils, such as olive, canola, soybean, or sunflower oil. Meal planning  Eat a balanced diet that includes: ? 5 or more servings of fruits and vegetables each day. At each meal, try to fill half of your plate with fruits and vegetables. ? Up to 6-8 servings of whole grains each day. ? Less than 6 oz of lean meat, poultry, or fish each day. A 3-oz serving of meat is about the same size as a deck of cards. One egg equals 1 oz. ? 2 servings of low-fat dairy each day. ? A serving of nuts, seeds, or beans 5 times each week. ? Heart-healthy fats. Healthy fats called Omega-3 fatty acids are found in foods such as flaxseeds and coldwater fish, like sardines, salmon, and mackerel.  Limit how much you eat of the following: ? Canned or prepackaged foods. ? Food that is high in trans fat, such as fried foods. ? Food that is high  in saturated fat, such as fatty meat. ? Sweets, desserts, sugary drinks, and other foods with added sugar. ? Full-fat dairy products.  Do not salt foods before eating.  Try to eat at least 2 vegetarian meals each week.  Eat more home-cooked food and less restaurant, buffet, and fast food.  When eating at a restaurant, ask that your food be prepared with less salt or no salt, if possible. What foods are recommended? The items listed may not be a complete list. Talk with your dietitian about what dietary choices are best for you. Grains Whole-grain or  whole-wheat bread. Whole-grain or whole-wheat pasta. Brown rice. [place]. Bulgur. Whole-grain and low-sodium cereals. Pita bread. Low-fat, low-sodium crackers. Whole-wheat flour tortillas. Vegetables Fresh or frozen vegetables (raw, steamed, roasted, or grilled). Low-sodium or reduced-sodium tomato and vegetable juice. Low-sodium or reduced-sodium tomato sauce and tomato paste. Low-sodium or reduced-sodium canned vegetables. Fruits All fresh, dried, or frozen fruit. Canned fruit in natural juice (without added sugar). Meat and other protein foods Skinless chicken or [place]. Ground chicken or [place]. Pork with fat trimmed off. Fish and seafood. Egg whites. Dried beans, peas, or lentils. Unsalted nuts, nut butters, and seeds. Unsalted canned beans. Lean cuts of beef with fat trimmed off. Low-sodium, lean deli meat. Dairy Low-fat (1%) or fat-free (skim) milk. Fat-free, low-fat, or reduced-fat cheeses. Nonfat, low-sodium ricotta or cottage cheese. Low-fat or nonfat yogurt. Low-fat, low-sodium cheese. Fats and oils Soft margarine without trans fats. Vegetable oil. Low-fat, reduced-fat, or light mayonnaise and salad dressings (reduced-sodium). Canola, safflower, olive, soybean, and sunflower oils. Avocado. Seasoning and other foods Herbs. Spices. Seasoning mixes without salt. Unsalted popcorn and pretzels. Fat-free sweets. What foods are not recommended? The items listed may not be a complete list. Talk with your dietitian about what dietary choices are best for you. Grains Baked goods made with fat, such as croissants, muffins, or some breads. Dry pasta or rice meal packs. Vegetables Creamed or fried vegetables. Vegetables in a cheese sauce. Regular canned vegetables (not low-sodium or reduced-sodium). Regular canned tomato sauce and paste (not low-sodium or reduced-sodium). Regular tomato and vegetable juice (not low-sodium or reduced-sodium). Angie Fava. Olives. Fruits Canned fruit in a light  or heavy syrup. Fried fruit. Fruit in cream or butter sauce. Meat and other protein foods Fatty cuts of meat. Ribs. Fried meat. Berniece Salines. Sausage. Bologna and other processed lunch meats. Salami. Fatback. Hotdogs. Bratwurst. Salted nuts and seeds. Canned beans with added salt. Canned or smoked fish. Whole eggs or egg yolks. Chicken or [place] with skin. Dairy Whole or 2% milk, cream, and half-and-half. Whole or full-fat cream cheese. Whole-fat or sweetened yogurt. Full-fat cheese. Nondairy creamers. Whipped toppings. Processed cheese and cheese spreads. Fats and oils Butter. Stick margarine. Lard. Shortening. Ghee. Bacon fat. Tropical oils, such as coconut, palm kernel, or palm oil. Seasoning and other foods Salted popcorn and pretzels. Onion salt, garlic salt, seasoned salt, table salt, and sea salt. Worcestershire sauce. Tartar sauce. Barbecue sauce. Teriyaki sauce. Soy sauce, including reduced-sodium. Steak sauce. Canned and packaged gravies. Fish sauce. Oyster sauce. Cocktail sauce. Horseradish that you find on the shelf. Ketchup. Mustard. Meat flavorings and tenderizers. Bouillon cubes. Hot sauce and Tabasco sauce. Premade or packaged marinades. Premade or packaged taco seasonings. Relishes. Regular salad dressings. Where to find more information:  National Heart, Lung, and [place]: https://wilson-eaton.com/  American Heart Association: www.heart.org Summary  The DASH eating plan is a healthy eating plan that has been shown to reduce high blood pressure (hypertension). It may  also reduce your risk for type 2 diabetes, heart disease, and stroke.  With the DASH eating plan, you should limit salt (sodium) intake to 2,300 mg a day. If you have hypertension, you may need to reduce your sodium intake to 1,500 mg a day.  When on the DASH eating plan, aim to eat more fresh fruits and vegetables, whole grains, lean proteins, low-fat dairy, and heart-healthy fats.  Work with your health care provider or  diet and nutrition specialist (dietitian) to adjust your eating plan to your individual calorie needs. This information is not intended to replace advice given to you by your health care provider. Make sure you discuss any questions you have with your health care provider. Document Released: 08/13/2011 Document Revised: 08/17/2016 Document Reviewed: 08/17/2016 Elsevier Interactive Patient Education  2019 [employer].

## 2018-09-29 DIAGNOSIS — E1165 Type 2 diabetes mellitus with hyperglycemia: Secondary | ICD-10-CM | POA: Diagnosis not present

## 2018-09-29 DIAGNOSIS — Z472 Encounter for removal of internal fixation device: Secondary | ICD-10-CM | POA: Diagnosis not present

## 2018-09-29 DIAGNOSIS — I10 Essential (primary) hypertension: Secondary | ICD-10-CM | POA: Diagnosis not present

## 2018-09-29 DIAGNOSIS — E785 Hyperlipidemia, unspecified: Secondary | ICD-10-CM | POA: Diagnosis not present

## 2018-09-29 DIAGNOSIS — I671 Cerebral aneurysm, nonruptured: Secondary | ICD-10-CM | POA: Diagnosis not present

## 2018-09-29 DIAGNOSIS — I959 Hypotension, unspecified: Secondary | ICD-10-CM | POA: Diagnosis not present

## 2018-09-29 DIAGNOSIS — F17210 Nicotine dependence, cigarettes, uncomplicated: Secondary | ICD-10-CM | POA: Diagnosis not present

## 2018-09-30 DIAGNOSIS — E1165 Type 2 diabetes mellitus with hyperglycemia: Secondary | ICD-10-CM | POA: Diagnosis not present

## 2018-09-30 DIAGNOSIS — E785 Hyperlipidemia, unspecified: Secondary | ICD-10-CM | POA: Diagnosis not present

## 2018-09-30 DIAGNOSIS — I671 Cerebral aneurysm, nonruptured: Secondary | ICD-10-CM | POA: Diagnosis not present

## 2018-09-30 DIAGNOSIS — I10 Essential (primary) hypertension: Secondary | ICD-10-CM | POA: Diagnosis not present

## 2018-09-30 DIAGNOSIS — Z472 Encounter for removal of internal fixation device: Secondary | ICD-10-CM | POA: Diagnosis not present

## 2018-10-01 DIAGNOSIS — E785 Hyperlipidemia, unspecified: Secondary | ICD-10-CM | POA: Diagnosis not present

## 2018-10-01 DIAGNOSIS — E1165 Type 2 diabetes mellitus with hyperglycemia: Secondary | ICD-10-CM | POA: Diagnosis not present

## 2018-10-01 DIAGNOSIS — I671 Cerebral aneurysm, nonruptured: Secondary | ICD-10-CM | POA: Diagnosis not present

## 2018-10-01 DIAGNOSIS — I10 Essential (primary) hypertension: Secondary | ICD-10-CM | POA: Diagnosis not present

## 2018-10-02 MED ORDER — ONDANSETRON HCL 4 MG/2ML IJ SOLN
4.00 | INTRAMUSCULAR | Status: DC
Start: ? — End: 2018-10-02

## 2018-10-02 MED ORDER — GENERIC EXTERNAL MEDICATION
17.00 | Status: DC
Start: ? — End: 2018-10-02

## 2018-10-02 MED ORDER — LISINOPRIL 5 MG PO TABS
10.00 | ORAL_TABLET | ORAL | Status: DC
Start: 2018-10-03 — End: 2018-10-02

## 2018-10-02 MED ORDER — HYDROCODONE-ACETAMINOPHEN 5-325 MG PO TABS
1.00 | ORAL_TABLET | ORAL | Status: DC
Start: ? — End: 2018-10-02

## 2018-10-02 MED ORDER — ATORVASTATIN CALCIUM 10 MG PO TABS
20.00 | ORAL_TABLET | ORAL | Status: DC
Start: 2018-10-03 — End: 2018-10-02

## 2018-10-02 MED ORDER — AMLODIPINE BESYLATE 5 MG PO TABS
5.00 | ORAL_TABLET | ORAL | Status: DC
Start: 2018-10-03 — End: 2018-10-02

## 2018-10-02 MED ORDER — HYDROCODONE-ACETAMINOPHEN 5-325 MG PO TABS
2.00 | ORAL_TABLET | ORAL | Status: DC
Start: ? — End: 2018-10-02

## 2018-10-02 MED ORDER — MUPIROCIN 2 % EX OINT
TOPICAL_OINTMENT | CUTANEOUS | Status: DC
Start: 2018-10-02 — End: 2018-10-02

## 2018-10-02 MED ORDER — INSULIN LISPRO 100 UNIT/ML ~~LOC~~ SOLN
2.00 | SUBCUTANEOUS | Status: DC
Start: 2018-10-02 — End: 2018-10-02

## 2018-10-02 MED ORDER — CHLORHEXIDINE GLUCONATE 0.12 % MT SOLN
15.00 | OROMUCOSAL | Status: DC
Start: ? — End: 2018-10-02

## 2018-10-02 MED ORDER — CHLORHEXIDINE GLUCONATE 0.12 % MT SOLN
15.00 | OROMUCOSAL | Status: DC
Start: 2018-10-02 — End: 2018-10-02

## 2018-10-02 MED ORDER — DEXTROSE 10 % IV SOLN
125.00 | INTRAVENOUS | Status: DC
Start: ? — End: 2018-10-02

## 2018-10-24 ENCOUNTER — Encounter: Payer: Self-pay | Admitting: Family Medicine

## 2018-10-24 ENCOUNTER — Ambulatory Visit (INDEPENDENT_AMBULATORY_CARE_PROVIDER_SITE_OTHER): Payer: BLUE CROSS/BLUE SHIELD | Admitting: Family Medicine

## 2018-10-24 VITALS — BP 136/86 | Ht 67.5 in | Wt 216.4 lb

## 2018-10-24 DIAGNOSIS — E785 Hyperlipidemia, unspecified: Secondary | ICD-10-CM | POA: Diagnosis not present

## 2018-10-24 DIAGNOSIS — E1165 Type 2 diabetes mellitus with hyperglycemia: Secondary | ICD-10-CM | POA: Diagnosis not present

## 2018-10-24 DIAGNOSIS — I10 Essential (primary) hypertension: Secondary | ICD-10-CM

## 2018-10-24 NOTE — Progress Notes (Signed)
   Subjective:    Patient ID: Denise Hill, female    DOB: June 18, 1961, 58 y.o.   MRN: [id number]  Hypertension  This is a chronic problem. The current episode started more than 1 year ago. Pertinent negatives include no chest pain, headaches or shortness of breath. Risk factors for coronary artery disease include diabetes mellitus, post-menopausal state and dyslipidemia. Treatments tried: norvasc, lisinopril. There are no compliance problems.    Home BPs running 140/80 per patient. Has been taking the amlodipine and lisinopril everyday. Ran out of the metoprolol 2 days ago, but will go get refills. No adverse effects. States she is doing well after her craniotomy surgery for cerebral aneurysms.   Diabetes: forgot to bring blood sugar log. Can not remember what her blood sugars have been running. Compliant with medications. Does not forget doses since they've been changed to the extended release. Denies any hypoglycemia. No adverse effects.   Hyperlipidemia: Taking lipitor everyday, denies any adverse effects.    States is taking Chantix everyday, but still smoking a little less than 1/2 ppd. Has not set a date to quit yet.   Review of Systems  Constitutional: Negative for fever and unexpected weight change.  Eyes: Negative for visual disturbance.  Respiratory: Negative for shortness of breath.   Cardiovascular: Negative for chest pain and leg swelling.  Neurological: Negative for syncope and headaches.       Objective:   Physical Exam Vitals signs and nursing note reviewed.  Constitutional:      General: She is not in acute distress.    Appearance: Normal appearance. She is not toxic-appearing.  HENT:     Head: Normocephalic and atraumatic.  Neck:     Musculoskeletal: Neck supple.  Cardiovascular:     Rate and Rhythm: Normal rate and regular rhythm.     Heart sounds: Normal heart sounds.  Pulmonary:     Effort: Pulmonary effort is normal. No respiratory distress.     Breath  sounds: Normal breath sounds.  Skin:    General: Skin is warm and dry.  Neurological:     Mental Status: She is alert and oriented to person, place, and time.  Psychiatric:        Behavior: Behavior normal.           Assessment & Plan:  1. Essential hypertension, benign BP under much better control on current medications. Will continue current regimen. Encouraged healthy diet and exercise. Also strongly encouraged to set a quit date for smoking, she states she has cut down and is working on quitting completely.   2. Type 2 diabetes mellitus with hyperglycemia, without long-term current use of insulin ([facility]) Unable to make any changes to her medications at this time without her home blood sugar logs. Pt states she is doing much better at not missing any doses of her medications since we switched to the extended release formulations. She will drop off her blood sugar log as soon as she can so we can review and possibly make changes.   3. Hyperlipidemia Reports compliant with medication. Will recheck lipid/liver prior to next f/u visit.   Recommend f/u with Dr. Nicki Reaper in 3-4 months, with lab work prior to appointment.

## 2018-10-24 NOTE — Patient Instructions (Signed)

## 2019-01-09 DIAGNOSIS — I671 Cerebral aneurysm, nonruptured: Secondary | ICD-10-CM | POA: Diagnosis not present

## 2019-01-09 DIAGNOSIS — Z9889 Other specified postprocedural states: Secondary | ICD-10-CM | POA: Diagnosis not present

## 2019-01-25 ENCOUNTER — Ambulatory Visit: Payer: BLUE CROSS/BLUE SHIELD | Admitting: Family Medicine

## 2019-02-01 ENCOUNTER — Other Ambulatory Visit: Payer: Self-pay

## 2019-02-01 MED ORDER — GLIPIZIDE ER 5 MG PO TB24: 5.0000 mg | ORAL_TABLET | Freq: Every day | ORAL | 5 refills | Status: DC

## 2019-02-14 ENCOUNTER — Telehealth: Payer: Self-pay | Admitting: Family Medicine

## 2019-02-14 DIAGNOSIS — Z20822 Contact with and (suspected) exposure to covid-19: Secondary | ICD-10-CM

## 2019-02-14 NOTE — Telephone Encounter (Signed)
Please advise. Thank you

## 2019-02-14 NOTE — Telephone Encounter (Signed)
Pt referred by Dr.Luking to be tested for the covid-19. Pt notified and scheduled for tomorrow Wednesday 02/15/19 at 10:30 at the [facility] in [place]  She was advised to wear a mask, stay in car with windows rolled up until time for testing. Pt voiced understanding.

## 2019-02-14 NOTE — Telephone Encounter (Signed)
Ok lets do, let her know several days for results usually

## 2019-02-14 NOTE — Telephone Encounter (Signed)
Pt needing COVID test done.

## 2019-02-14 NOTE — Telephone Encounter (Signed)
Called pt to scheduled covid -19 testing. No answer. Unable to lvm due to vm not being set up.

## 2019-02-14 NOTE — Telephone Encounter (Signed)
Patient said that her co-worker tested positive for COVID and now her work is requiring her to go get tested. Will need an order.

## 2019-02-15 ENCOUNTER — Other Ambulatory Visit: Payer: Self-pay

## 2019-02-15 DIAGNOSIS — Z20822 Contact with and (suspected) exposure to covid-19: Secondary | ICD-10-CM

## 2019-02-20 ENCOUNTER — Telehealth: Payer: Self-pay | Admitting: Family Medicine

## 2019-02-20 ENCOUNTER — Telehealth: Payer: Self-pay | Admitting: *Deleted

## 2019-02-20 NOTE — Telephone Encounter (Signed)
Pt had covid 19 test on 6/10 and called office today for results. Wanted to follow up to see if results were available. I do not see them in epic and if not when will results be available. Thanks

## 2019-02-20 NOTE — Telephone Encounter (Signed)
Pt would like to know the status of her Covid 19 test. She went across from [facility] on Wednesday.   [phone]

## 2019-02-20 NOTE — Telephone Encounter (Signed)
Someone pt came in contact was positive. Pt states she is not having symptoms but has been in quarantine til she knows if she is positive or negative so she needs to find out. Do you want to reorder test?

## 2019-02-20 NOTE — Telephone Encounter (Signed)
No results available in epic. Message sent to community pool to check on results.

## 2019-02-20 NOTE — Telephone Encounter (Signed)
Sent message to testing center and was told to call [facility]. Called [facility] and was told to call testing center because they never received a specimen for this pt. I sent another message to testing center. Await response from them.

## 2019-02-20 NOTE — Telephone Encounter (Signed)
Based on this I would reorder the test It seems to me that more than likely the specimen was lost Certainly if they can find a specimen around the test that resolves this issue Otherwise the only solution is to reorder the test Thank you

## 2019-02-20 NOTE — Telephone Encounter (Signed)
I do not see any results as of yet.  Please encourage patient to sign up for my chart as that will be the fastest way to receive results.  If the patient went for results, you could probably follow up with [facility].

## 2019-02-20 NOTE — Telephone Encounter (Signed)
Called [facility] and they have no results for her and I was advised by them to call the testing center.

## 2019-02-21 ENCOUNTER — Telehealth: Payer: Self-pay | Admitting: Hematology

## 2019-02-21 ENCOUNTER — Telehealth: Payer: Self-pay | Admitting: *Deleted

## 2019-02-21 ENCOUNTER — Other Ambulatory Visit: Payer: BC Managed Care – PPO

## 2019-02-21 DIAGNOSIS — Z20822 Contact with and (suspected) exposure to covid-19: Secondary | ICD-10-CM

## 2019-02-21 LAB — NOVEL CORONAVIRUS, NAA: SARS-CoV-2, NAA: NOT DETECTED

## 2019-02-21 NOTE — Telephone Encounter (Signed)
No covid results as of now in the chart.

## 2019-02-21 NOTE — Telephone Encounter (Signed)
See other message. [facility] found sample and will run test.

## 2019-02-21 NOTE — Telephone Encounter (Signed)
Called patient back and advised that Denise Hill found her sample and should have it resulted by 02-24-2019 / Patient verbalizes understanding.

## 2019-02-21 NOTE — Telephone Encounter (Signed)
Patient went for testing on 02-15-2019 per provider, [facility] does not have the specimen /

## 2019-02-21 NOTE — Telephone Encounter (Signed)
Thank you :)

## 2019-02-21 NOTE — Telephone Encounter (Signed)
Message sent to community testing pool to repeat test and I called pt to notify her they will be calling her to set up time to repeat test

## 2019-02-21 NOTE — Telephone Encounter (Signed)
Pt had covid 19 test on 6/10 and called our office yesterday for results. I contacted [facility] and they did not receive a specimen. Dr. Nicki Reaper would like test repeated since it seems to be lost. Please contact pt to have test repeated. Thanks.

## 2019-02-23 NOTE — Telephone Encounter (Signed)
Pt notified test was negative per dr Nicki Reaper luking. See result note.

## 2019-06-16 ENCOUNTER — Ambulatory Visit (INDEPENDENT_AMBULATORY_CARE_PROVIDER_SITE_OTHER): Payer: BC Managed Care – PPO | Admitting: Family Medicine

## 2019-06-16 ENCOUNTER — Other Ambulatory Visit: Payer: Self-pay

## 2019-06-16 ENCOUNTER — Other Ambulatory Visit: Payer: Self-pay | Admitting: *Deleted

## 2019-06-16 DIAGNOSIS — E1165 Type 2 diabetes mellitus with hyperglycemia: Secondary | ICD-10-CM | POA: Diagnosis not present

## 2019-06-16 DIAGNOSIS — I10 Essential (primary) hypertension: Secondary | ICD-10-CM | POA: Diagnosis not present

## 2019-06-16 DIAGNOSIS — E785 Hyperlipidemia, unspecified: Secondary | ICD-10-CM | POA: Diagnosis not present

## 2019-06-16 DIAGNOSIS — Z20828 Contact with and (suspected) exposure to other viral communicable diseases: Secondary | ICD-10-CM | POA: Diagnosis not present

## 2019-06-16 DIAGNOSIS — Z79899 Other long term (current) drug therapy: Secondary | ICD-10-CM

## 2019-06-16 DIAGNOSIS — Z20822 Contact with and (suspected) exposure to covid-19: Secondary | ICD-10-CM

## 2019-06-16 MED ORDER — ATORVASTATIN CALCIUM 20 MG PO TABS: 20.0000 mg | ORAL_TABLET | Freq: Every day | ORAL | 1 refills | Status: DC

## 2019-06-16 MED ORDER — GLIPIZIDE ER 5 MG PO TB24: 5.0000 mg | ORAL_TABLET | Freq: Every day | ORAL | 5 refills | Status: DC

## 2019-06-16 MED ORDER — AMLODIPINE BESYLATE 5 MG PO TABS: 5.0000 mg | ORAL_TABLET | Freq: Every day | ORAL | 5 refills | Status: DC

## 2019-06-16 MED ORDER — METFORMIN HCL ER 500 MG PO TB24: 500.0000 mg | ORAL_TABLET | Freq: Every day | ORAL | 5 refills | Status: DC

## 2019-06-16 MED ORDER — LISINOPRIL 20 MG PO TABS: 20.0000 mg | ORAL_TABLET | Freq: Every day | ORAL | 5 refills | Status: DC

## 2019-06-16 MED ORDER — METOPROLOL TARTRATE 50 MG PO TABS: 50.0000 mg | ORAL_TABLET | Freq: Two times a day (BID) | ORAL | 5 refills | Status: DC

## 2019-06-16 NOTE — Progress Notes (Signed)
Subjective:    Patient ID: Denise Hill, female    DOB: 19-Oct-1960, 58 y.o.   MRN: [id number]  Diabetes She presents for her follow-up diabetic visit. She has type 2 diabetes mellitus. Pertinent negatives for hypoglycemia include no confusion or dizziness. Pertinent negatives for diabetes include no chest pain, no fatigue, no polydipsia, no polyphagia and no weakness. Current diabetic treatments: metformin, glipizide. Home blood sugar record trend: does not check blood sugar.  Her diabetes she thinks is under decent control she does take her medication denies any low sugar spells  She states her blood pressure under decent control she does take her medicine tries to stay physically active and tries to watch what she eats tries to minimize salt  Patient denies being depressed denies any falls or injuries  She does have hyperlipidemia she takes her medication on a regular basis denies any problems does need to do lab work Nose stopped up since yesterday.  Patient denies drainage coughing wheezing.  Patient was told that sometimes this is a sign of Covid I encouraged her to go get checked Virtual Visit via Telephone Note  I connected with Talise B Younkin on 06/16/19 at  1:10 PM EDT by telephone and verified that I am speaking with the correct person using two identifiers.  Location: Patient: home Provider: office   I discussed the limitations, risks, security and privacy concerns of performing an evaluation and management service by telephone and the availability of in person appointments. I also discussed with the patient that there may be a patient responsible charge related to this service. The patient expressed understanding and agreed to proceed.   History of Present Illness:    Observations/Objective:   Assessment and Plan:   Follow Up Instructions:    I discussed the assessment and treatment plan with the patient. The patient was provided an opportunity to ask questions  and all were answered. The patient agreed with the plan and demonstrated an understanding of the instructions.   The patient was advised to call back or seek an in-person evaluation if the symptoms worsen or if the condition fails to improve as anticipated.  I provided 18 minutes of non-face-to-face time during this encounter.     Review of Systems  Constitutional: Negative for activity change, appetite change and fatigue.  HENT: Negative for congestion and rhinorrhea.   Respiratory: Negative for cough and shortness of breath.   Cardiovascular: Negative for chest pain and leg swelling.  Gastrointestinal: Negative for abdominal pain and diarrhea.  Endocrine: Negative for polydipsia and polyphagia.  Skin: Negative for color change.  Neurological: Negative for dizziness and weakness.  Psychiatric/Behavioral: Negative for behavioral problems and confusion.       Objective:   Physical Exam Today's visit was via telephone Physical exam was not possible for this visit        Assessment & Plan:  The patient was seen today as part of a comprehensive visit for diabetes. The importance of keeping her A1c at or below 7 was discussed.  Importance of regular physical activity was discussed.   The importance of adherence to medication as well as a controlled low starch/sugar diet was also discussed.  Standard follow-up visit recommended.  Also patient aware failure to keep diabetes under control increases the risk of complications. Lab work ordered await results  HTN- Patient was seen today as part of a visit regarding hypertension. The importance of healthy diet and regular physical activity was discussed. The importance of compliance  with medications discussed.  Ideal goal is to keep blood pressure low elevated levels certainly below [id number] when possible.  The patient was counseled that keeping blood pressure under control lessen his risk of complications.  The importance of regular  follow-ups was discussed with the patient.  Low-salt diet such as DASH recommended.  Regular physical activity was recommended as well.  Patient was advised to keep regular follow-ups.  Lab work ordered await results  Nasal congestion I recommend she go get Covid testing  Hyperlipidemia take her medicine check lab work await results

## 2019-06-18 LAB — NOVEL CORONAVIRUS, NAA: SARS-CoV-2, NAA: NOT DETECTED

## 2019-11-18 ENCOUNTER — Ambulatory Visit: Payer: BC Managed Care – PPO | Attending: Internal Medicine

## 2019-11-18 DIAGNOSIS — Z23 Encounter for immunization: Secondary | ICD-10-CM

## 2019-11-18 NOTE — Progress Notes (Signed)
   Covid-19 Vaccination Clinic  Name:  Denise Hill    MRN: [id number] DOB: 04-14-1961  11/18/2019  Denise Hill was observed post Covid-19 immunization for 15 minutes without incident. She was provided with Vaccine Information Sheet and instruction to access the V-Safe system.   Denise Hill was instructed to call 911 with any severe reactions post vaccine: Marland Kitchen Difficulty breathing  . Swelling of face and throat  . A fast heartbeat  . A bad rash all over body  . Dizziness and weakness   Immunizations Administered    Name Date Dose VIS Date Route   Moderna COVID-19 Vaccine 11/18/2019 11:56 AM 0.5 mL 08/08/2019 Intramuscular   Manufacturer: Moderna   Lot: [id number]   [id number]

## 2019-12-20 ENCOUNTER — Ambulatory Visit: Payer: BC Managed Care – PPO | Attending: Internal Medicine

## 2019-12-20 DIAGNOSIS — Z23 Encounter for immunization: Secondary | ICD-10-CM

## 2019-12-20 NOTE — Progress Notes (Signed)
   Covid-19 Vaccination [facility]  Name:  Denise Hill    MRN: [id number] DOB: [birth year]  12/20/2019  [name] was observed post Covid-19 immunization for 15 minutes without incident. She was provided with Vaccine Information Sheet and instruction to access the V-Safe system.   [name] was instructed to call 911 with any severe reactions post vaccine: Marland Kitchen Difficulty breathing  . Swelling of face and throat  . A fast heartbeat  . A bad rash all over body  . Dizziness and weakness   Immunizations Administered    Name Date Dose VIS Date Route   Moderna COVID-19 Vaccine 12/20/2019 11:31 AM 0.5 mL 08/08/2019 Intramuscular   Manufacturer: Moderna   Lot: [id number]   [id number]

## 2020-01-16 ENCOUNTER — Other Ambulatory Visit: Payer: Self-pay | Admitting: Family Medicine

## 2020-01-16 DIAGNOSIS — E785 Hyperlipidemia, unspecified: Secondary | ICD-10-CM

## 2020-01-16 DIAGNOSIS — I10 Essential (primary) hypertension: Secondary | ICD-10-CM

## 2020-01-16 DIAGNOSIS — E1165 Type 2 diabetes mellitus with hyperglycemia: Secondary | ICD-10-CM

## 2020-01-16 DIAGNOSIS — Z79899 Other long term (current) drug therapy: Secondary | ICD-10-CM

## 2020-01-16 NOTE — Telephone Encounter (Signed)
All lab ordered October 2020 please reorder have patient do these before her follow-up may have this refill follow-up within 90 days

## 2020-01-17 NOTE — Telephone Encounter (Signed)
Left message to return call 

## 2020-02-14 DIAGNOSIS — E1165 Type 2 diabetes mellitus with hyperglycemia: Secondary | ICD-10-CM | POA: Diagnosis not present

## 2020-02-14 DIAGNOSIS — Z79899 Other long term (current) drug therapy: Secondary | ICD-10-CM | POA: Diagnosis not present

## 2020-02-14 DIAGNOSIS — I10 Essential (primary) hypertension: Secondary | ICD-10-CM | POA: Diagnosis not present

## 2020-02-14 DIAGNOSIS — E785 Hyperlipidemia, unspecified: Secondary | ICD-10-CM | POA: Diagnosis not present

## 2020-02-15 LAB — HEPATIC FUNCTION PANEL
ALT: 14 IU/L (ref 0–32)
AST: 14 IU/L (ref 0–40)
Albumin: 4.2 g/dL (ref 3.8–4.9)
Alkaline Phosphatase: 83 IU/L (ref 48–121)
Bilirubin Total: <0.2 mg/dL (ref 0.0–1.2)
Bilirubin, Direct: 0.08 mg/dL (ref 0.00–0.40)
Total Protein: 6.8 g/dL (ref 6.0–8.5)

## 2020-02-15 LAB — LIPID PANEL
Chol/HDL Ratio: 4.6 ratio — ABNORMAL HIGH (ref 0.0–4.4)
Cholesterol, Total: 194 mg/dL (ref 100–199)
HDL: 42 mg/dL (ref >39)
LDL Chol Calc (NIH): 125 mg/dL — ABNORMAL HIGH (ref 0–99)
Triglycerides: 150 mg/dL — ABNORMAL HIGH (ref 0–149)
VLDL Cholesterol Cal: 27 mg/dL (ref 5–40)

## 2020-02-15 LAB — BASIC METABOLIC PANEL
BUN/Creatinine Ratio: 11 (ref 9–23)
BUN: 8 mg/dL (ref 6–24)
CO2: 24 mmol/L (ref 20–29)
Calcium: 9.5 mg/dL (ref 8.7–10.2)
Chloride: 105 mmol/L (ref 96–106)
Creatinine, Ser: 0.74 mg/dL (ref 0.57–1.00)
GFR calc Af Amer: 105 mL/min/{1.73_m2} (ref >59)
GFR calc non Af Amer: 91 mL/min/{1.73_m2} (ref >59)
Glucose: 157 mg/dL — ABNORMAL HIGH (ref 65–99)
Potassium: 4.5 mmol/L (ref 3.5–5.2)
Sodium: 143 mmol/L (ref 134–144)

## 2020-02-15 LAB — HEMOGLOBIN A1C
Est. average glucose Bld gHb Est-mCnc: 200 mg/dL
Hgb A1c MFr Bld: 8.6 % — ABNORMAL HIGH (ref 4.8–5.6)

## 2020-02-15 LAB — MICROALBUMIN / CREATININE URINE RATIO
Creatinine, Urine: 141.9 mg/dL
Microalb/Creat Ratio: 3 mg/g creat (ref 0–29)
Microalbumin, Urine: 3.6 ug/mL

## 2020-02-16 ENCOUNTER — Ambulatory Visit (INDEPENDENT_AMBULATORY_CARE_PROVIDER_SITE_OTHER): Payer: BC Managed Care – PPO | Admitting: Nurse Practitioner

## 2020-02-16 ENCOUNTER — Encounter: Payer: Self-pay | Admitting: Nurse Practitioner

## 2020-02-16 ENCOUNTER — Other Ambulatory Visit: Payer: Self-pay

## 2020-02-16 VITALS — BP 164/98 | Temp 97.9°F | Wt 226.0 lb

## 2020-02-16 DIAGNOSIS — E785 Hyperlipidemia, unspecified: Secondary | ICD-10-CM

## 2020-02-16 DIAGNOSIS — E1165 Type 2 diabetes mellitus with hyperglycemia: Secondary | ICD-10-CM | POA: Diagnosis not present

## 2020-02-16 DIAGNOSIS — I10 Essential (primary) hypertension: Secondary | ICD-10-CM | POA: Diagnosis not present

## 2020-02-16 DIAGNOSIS — J300 Vasomotor rhinitis: Secondary | ICD-10-CM | POA: Diagnosis not present

## 2020-02-16 MED ORDER — AMLODIPINE BESYLATE 10 MG PO TABS: 10.0000 mg | ORAL_TABLET | Freq: Every day | ORAL | 5 refills | Status: DC

## 2020-02-16 MED ORDER — GLIPIZIDE ER 10 MG PO TB24: 10.0000 mg | ORAL_TABLET | Freq: Every day | ORAL | 5 refills | Status: DC

## 2020-02-16 NOTE — Progress Notes (Signed)
Subjective:    Patient ID: Denise Hill, female    DOB: 1961/03/09, 59 y.o.   MRN: [id number]  HPI  Presents for follow up related to diabetes, hypertension and hyperlipidemia. Diagnosis of type 2 diabetes mellitus currently taking glipizide and metformin as prescribed. Does not check sugars at home. Notes that she is not adhering to a diet or exercise regimen. Notes that she is not followed by a podiatrist and has not had eye exam related to [place] but she usually has completed. Hypertension continues to be a concern but does not complete home blood pressure monitoring. Has complaints of not being able to hear out of left ear for one week. Denies fever, pain or drainage from ear. States she continues to smoke tobacco daily averaging a pack per day.  Having difficulty hearing out of her left ear x 1 week.    Review of Systems  Constitutional: Negative for activity change, appetite change, fatigue, fever and unexpected weight change.  HENT: Positive for hearing loss. Negative for congestion, ear discharge, ear pain, sinus pressure and sinus pain.   Respiratory: Negative for cough, chest tightness, shortness of breath and wheezing.   Cardiovascular: Negative for chest pain, palpitations and leg swelling.  Gastrointestinal: Negative for abdominal distention, diarrhea, nausea and vomiting.  Endocrine: Negative for polydipsia, polyphagia and polyuria.  Neurological: Negative for facial asymmetry, weakness and numbness.       Objective:   Physical Exam Constitutional:      Appearance: Normal appearance.  HENT:     Ears:     Comments: TMs clear effusion, more on the left, no erythema. Left TM partially obscured with light cerumen but not impacted.     Nose:     Comments: Nasal mucosa pink and boggy, occluded on the left.     Mouth/Throat:     Mouth: Mucous membranes are moist.     Pharynx: No pharyngeal swelling, oropharyngeal exudate, posterior oropharyngeal erythema or uvula swelling.      Tonsils: No tonsillar exudate or tonsillar abscesses.  Neck:     Thyroid: No thyroid mass, thyromegaly or thyroid tenderness.  Cardiovascular:     Rate and Rhythm: Normal rate and regular rhythm.     Heart sounds: Normal heart sounds, S1 normal and S2 normal. No murmur heard.      Comments: Blood pressure rechecked- 164/90 Pulmonary:     Effort: Pulmonary effort is normal.     Breath sounds: Normal breath sounds and air entry. No stridor, decreased air movement or transmitted upper airway sounds.  Lymphadenopathy:     Cervical: No cervical adenopathy.     Right cervical: No superficial, deep or posterior cervical adenopathy.    Left cervical: No superficial, deep or posterior cervical adenopathy.  Psychiatric:        Mood and Affect: Mood normal.        Behavior: Behavior normal.        Thought Content: Thought content normal.    Diabetic Foot Exam - Simple   Simple Foot Form Diabetic Foot exam was performed with the following findings: Yes 02/16/2020  4:00 PM  Visual Inspection No deformities, no ulcerations, no other skin breakdown bilaterally: Yes Sensation Testing Intact to touch and monofilament testing bilaterally: Yes Pulse Check Posterior Tibialis and Dorsalis pulse intact bilaterally: Yes Comments    Recent Results (from the past 2160 hour(s))  Lipid panel     Status: Abnormal   Collection Time: 02/14/20  8:35 AM  Result Value Ref  Range   Cholesterol, Total 194 100 - 199 mg/dL   Triglycerides 150 (H) 0 - 149 mg/dL   HDL 42 >39 mg/dL   VLDL Cholesterol Cal 27 5 - 40 mg/dL   LDL Chol Calc (NIH) 125 (H) 0 - 99 mg/dL   Chol/HDL Ratio 4.6 (H) 0.0 - 4.4 ratio    Comment:                                   T. Chol/HDL Ratio                                             Men  Women                               1/2 Avg.Risk  3.4    3.3                                   Avg.Risk  5.0    4.4                                2X Avg.Risk  9.6    7.1                                 3X Avg.Risk 23.4   11.0   Hepatic function panel     Status: None   Collection Time: 02/14/20  8:35 AM  Result Value Ref Range   Total Protein 6.8 6.0 - 8.5 g/dL   Albumin 4.2 3.8 - 4.9 g/dL   Bilirubin Total <0.2 0.0 - 1.2 mg/dL   Bilirubin, Direct 0.08 0.00 - 0.40 mg/dL   Alkaline Phosphatase 83 48 - 121 IU/L   AST 14 0 - 40 IU/L   ALT 14 0 - 32 IU/L  Basic metabolic panel     Status: Abnormal   Collection Time: 02/14/20  8:35 AM  Result Value Ref Range   Glucose 157 (H) 65 - 99 mg/dL   BUN 8 6 - 24 mg/dL   Creatinine, Ser 0.74 0.57 - 1.00 mg/dL   GFR calc non Af Amer 91 >59 mL/min/1.73   GFR calc Af Amer 105 >59 mL/min/1.73    Comment: **[facility] currently reports eGFR in compliance with the current**   recommendations of the [employer]. [facility] will   update reporting as new guidelines are published from the NKF-ASN   Task force.    BUN/Creatinine Ratio 11 9 - 23   Sodium 143 134 - 144 mmol/L   Potassium 4.5 3.5 - 5.2 mmol/L   Chloride 105 96 - 106 mmol/L   CO2 24 20 - 29 mmol/L   Calcium 9.5 8.7 - 10.2 mg/dL  Hemoglobin A1c     Status: Abnormal   Collection Time: 02/14/20  8:35 AM  Result Value Ref Range   Hgb A1c MFr Bld 8.6 (H) 4.8 - 5.6 %    Comment:          Prediabetes: 5.7 - 6.4  Diabetes: >6.4          Glycemic control for adults with diabetes: <7.0    Est. average glucose Bld gHb Est-mCnc 200 mg/dL  Microalbumin / creatinine urine ratio     Status: None   Collection Time: 02/14/20  8:35 AM  Result Value Ref Range   Creatinine, Urine 141.9 Not Estab. mg/dL   Microalbumin, Urine 3.6 Not Estab. ug/mL   Microalb/Creat Ratio 3 0 - 29 mg/g creat    Comment:                        Normal:                0 -  29                        Moderately increased: 30 - 300                        Severely increased:       >300    Labs reviewed with patient.     Assessment & Plan:   Problem List Items Addressed This Visit       Cardiovascular and Mediastinum   Essential hypertension, benign - Primary   Relevant Medications   amLODipine (NORVASC) 10 MG tablet     Endocrine   Type 2 diabetes mellitus (HCC)   Relevant Medications   glipiZIDE (GLUCOTROL XL) 10 MG 24 hr tablet     Other   Hyperlipidemia   Relevant Medications   amLODipine (NORVASC) 10 MG tablet    Other Visit Diagnoses    Vasomotor rhinitis         Meds ordered this encounter  Medications  . amLODipine (NORVASC) 10 MG tablet    Sig: Take 1 tablet (10 mg total) by mouth daily. For BP    Dispense:  30 tablet    Refill:  5    Order Specific Question:   Supervising Provider    Answer:   Sallee Lange A [[id number]]  . glipiZIDE (GLUCOTROL XL) 10 MG 24 hr tablet    Sig: Take 1 tablet (10 mg total) by mouth daily with breakfast. For diabetes    Dispense:  30 tablet    Refill:  5    Order Specific Question:   Supervising Provider    Answer:   Kathyrn Drown [phone]    Priority goals discussed with patient: reduce BP and glucose Educated on importance of diet and exercise plan including limiting sugar and simple carbs. Discussed medication options. Patient elects to increase Glipizide vs starting new medication. Increase Amlodipine to 10 mg. Education on smoking reduction/cessation. Advised OTC allegra and steroid nasal spray.  Order written for [facility] sensors. Also get free samples online. Recommend regular checks of sugar and BP. Bring log to next visit. Follow up in three months unless earlier appointment required. Plan to discuss lipid management at that time. Patient agrees with this plan.

## 2020-02-16 NOTE — Patient Instructions (Signed)
Increase Amlodipine 10mg  daily  Increase Glipizide 10mg  daily

## 2020-02-16 NOTE — Progress Notes (Signed)
   Subjective:    Patient ID: Denise Hill, female    DOB: [birth year], 59 y.o.   MRN: [id number]  Diabetes She presents for her follow-up diabetic visit. She has type 2 diabetes mellitus. Current diabetic treatments: glipizide and metformin. She is following a generally unhealthy diet. She does not see a podiatrist.Eye exam current: due for eye exam   Hypertension This is a chronic problem. Treatments tried: amlodipine, lisinopril and metoprolol.  Hyperlipidemia This is a chronic problem. Treatments tried: atorvastatin.   Patient has complaints of not being able to hear out of left ear x 1 week.   Review of Systems     Objective:   Physical Exam        Assessment & Plan:

## 2020-02-17 ENCOUNTER — Encounter: Payer: Self-pay | Admitting: Nurse Practitioner

## 2020-04-04 DIAGNOSIS — I671 Cerebral aneurysm, nonruptured: Secondary | ICD-10-CM | POA: Diagnosis not present

## 2020-04-04 DIAGNOSIS — Z9889 Other specified postprocedural states: Secondary | ICD-10-CM | POA: Diagnosis not present

## 2020-05-02 ENCOUNTER — Other Ambulatory Visit: Payer: Self-pay | Admitting: Family Medicine

## 2020-06-19 ENCOUNTER — Other Ambulatory Visit: Payer: Self-pay | Admitting: Family Medicine

## 2020-06-21 ENCOUNTER — Other Ambulatory Visit: Payer: Self-pay | Admitting: Family Medicine

## 2020-06-23 NOTE — Telephone Encounter (Signed)
90 days on each patient needs to do a follow-up office visit December or January

## 2020-07-22 DIAGNOSIS — Z20822 Contact with and (suspected) exposure to covid-19: Secondary | ICD-10-CM | POA: Diagnosis not present

## 2020-09-30 ENCOUNTER — Other Ambulatory Visit: Payer: Self-pay | Admitting: Family Medicine

## 2020-10-02 NOTE — Telephone Encounter (Signed)
Patient has appointment on 2/14 for med check by phone

## 2020-10-21 ENCOUNTER — Telehealth (INDEPENDENT_AMBULATORY_CARE_PROVIDER_SITE_OTHER): Payer: BC Managed Care – PPO | Admitting: Family Medicine

## 2020-10-21 ENCOUNTER — Other Ambulatory Visit: Payer: Self-pay

## 2020-10-21 DIAGNOSIS — B349 Viral infection, unspecified: Secondary | ICD-10-CM

## 2020-10-21 DIAGNOSIS — I10 Essential (primary) hypertension: Secondary | ICD-10-CM

## 2020-10-21 DIAGNOSIS — E559 Vitamin D deficiency, unspecified: Secondary | ICD-10-CM

## 2020-10-21 DIAGNOSIS — E785 Hyperlipidemia, unspecified: Secondary | ICD-10-CM

## 2020-10-21 DIAGNOSIS — E1165 Type 2 diabetes mellitus with hyperglycemia: Secondary | ICD-10-CM

## 2020-10-21 MED ORDER — GLIPIZIDE ER 10 MG PO TB24: 10.0000 mg | ORAL_TABLET | Freq: Every day | ORAL | 0 refills | Status: DC

## 2020-10-21 MED ORDER — ATORVASTATIN CALCIUM 20 MG PO TABS: 20.0000 mg | ORAL_TABLET | Freq: Every day | ORAL | 0 refills | Status: DC

## 2020-10-21 MED ORDER — LISINOPRIL 20 MG PO TABS: 20.0000 mg | ORAL_TABLET | Freq: Every day | ORAL | 0 refills | Status: DC

## 2020-10-21 MED ORDER — METOPROLOL TARTRATE 50 MG PO TABS: 50.0000 mg | ORAL_TABLET | Freq: Two times a day (BID) | ORAL | 0 refills | Status: DC

## 2020-10-21 MED ORDER — AMLODIPINE BESYLATE 10 MG PO TABS: 10.0000 mg | ORAL_TABLET | Freq: Every day | ORAL | 0 refills | Status: DC

## 2020-10-21 MED ORDER — METFORMIN HCL ER 500 MG PO TB24: ORAL_TABLET | ORAL | 0 refills | Status: DC

## 2020-10-21 NOTE — Progress Notes (Signed)
   Subjective:    Patient ID: Denise Hill, female    DOB: 1961/07/25, 60 y.o.   MRN: [id number]  Hypertension This is a chronic problem. The current episode started more than 1 year ago. Risk factors for coronary artery disease include diabetes mellitus and dyslipidemia. Treatments tried: lisinopril. There are no compliance problems.    Patient got hit with a viral illness over the past couple days she states she had COVID several weeks ago this could be the flu no difficulty breathing no chest pain discomfort.  Just mainly viral-like syndrome    Review of Systems     Objective:   Physical Exam  Today's visit was via telephone Physical exam was not possible for this visit       Assessment & Plan:  New onset viral illness I recommend for the patient who measures if worse to follow-up Hold off on any antibiotics currently Patient will call back if needing to be seen in person She will do her lab work we gave her refills she will do a in person visit in March regarding her chronic health issues    Virtual Visit via Telephone Note  I connected with Denise Hill on 10/21/20 at  8:40 AM EST by telephone and verified that I am speaking with the correct person using two identifiers.  Location: Patient: home Provider: office   I discussed the limitations, risks, security and privacy concerns of performing an evaluation and management service by telephone and the availability of in person appointments. I also discussed with the patient that there may be a patient responsible charge related to this service. The patient expressed understanding and agreed to proceed.   History of Present Illness:    Observations/Objective:   Assessment and Plan:   Follow Up Instructions:    I discussed the assessment and treatment plan with the patient. The patient was provided an opportunity to ask questions and all were answered. The patient agreed with the plan and demonstrated an  understanding of the instructions.   The patient was advised to call back or seek an in-person evaluation if the symptoms worsen or if the condition fails to improve as anticipated.  I provided 10 minutes of non-face-to-face time during this encounter.    It was decided not to charge the patient today because I like to do a comprehensive checkup on her within the month when she gets well from this virus

## 2020-12-30 ENCOUNTER — Other Ambulatory Visit: Payer: Self-pay | Admitting: Family Medicine

## 2020-12-30 DIAGNOSIS — E785 Hyperlipidemia, unspecified: Secondary | ICD-10-CM | POA: Diagnosis not present

## 2020-12-30 DIAGNOSIS — I10 Essential (primary) hypertension: Secondary | ICD-10-CM | POA: Diagnosis not present

## 2020-12-30 DIAGNOSIS — E559 Vitamin D deficiency, unspecified: Secondary | ICD-10-CM | POA: Diagnosis not present

## 2020-12-30 DIAGNOSIS — E1165 Type 2 diabetes mellitus with hyperglycemia: Secondary | ICD-10-CM | POA: Diagnosis not present

## 2020-12-31 LAB — COMPREHENSIVE METABOLIC PANEL
ALT: 11 IU/L (ref 0–32)
AST: 14 IU/L (ref 0–40)
Albumin/Globulin Ratio: 1.6 (ref 1.2–2.2)
Albumin: 4.2 g/dL (ref 3.8–4.9)
Alkaline Phosphatase: 90 IU/L (ref 44–121)
BUN/Creatinine Ratio: 11 (ref 9–23)
BUN: 9 mg/dL (ref 6–24)
Bilirubin Total: 0.3 mg/dL (ref 0.0–1.2)
CO2: 22 mmol/L (ref 20–29)
Calcium: 9.6 mg/dL (ref 8.7–10.2)
Chloride: 105 mmol/L (ref 96–106)
Creatinine, Ser: 0.79 mg/dL (ref 0.57–1.00)
Globulin, Total: 2.7 g/dL (ref 1.5–4.5)
Glucose: 161 mg/dL — ABNORMAL HIGH (ref 65–99)
Potassium: 4.7 mmol/L (ref 3.5–5.2)
Sodium: 141 mmol/L (ref 134–144)
Total Protein: 6.9 g/dL (ref 6.0–8.5)
eGFR: 87 mL/min/{1.73_m2} (ref >59)

## 2020-12-31 LAB — LIPID PANEL
Chol/HDL Ratio: 5.5 ratio — ABNORMAL HIGH (ref 0.0–4.4)
Cholesterol, Total: 249 mg/dL — ABNORMAL HIGH (ref 100–199)
HDL: 45 mg/dL (ref >39)
LDL Chol Calc (NIH): 173 mg/dL — ABNORMAL HIGH (ref 0–99)
Triglycerides: 170 mg/dL — ABNORMAL HIGH (ref 0–149)
VLDL Cholesterol Cal: 31 mg/dL (ref 5–40)

## 2020-12-31 LAB — HEMOGLOBIN A1C
Est. average glucose Bld gHb Est-mCnc: 192 mg/dL
Hgb A1c MFr Bld: 8.3 % — ABNORMAL HIGH (ref 4.8–5.6)

## 2020-12-31 LAB — VITAMIN D 25 HYDROXY (VIT D DEFICIENCY, FRACTURES): Vit D, 25-Hydroxy: 19.9 ng/mL — ABNORMAL LOW (ref 30.0–100.0)

## 2021-01-03 ENCOUNTER — Encounter: Payer: Self-pay | Admitting: Family Medicine

## 2021-01-03 ENCOUNTER — Other Ambulatory Visit: Payer: Self-pay

## 2021-01-03 ENCOUNTER — Ambulatory Visit: Payer: BC Managed Care – PPO | Admitting: Family Medicine

## 2021-01-03 VITALS — BP 162/80 | Temp 97.0°F | Ht 67.5 in | Wt 219.8 lb

## 2021-01-03 DIAGNOSIS — E1165 Type 2 diabetes mellitus with hyperglycemia: Secondary | ICD-10-CM

## 2021-01-03 DIAGNOSIS — Z8679 Personal history of other diseases of the circulatory system: Secondary | ICD-10-CM

## 2021-01-03 DIAGNOSIS — E785 Hyperlipidemia, unspecified: Secondary | ICD-10-CM

## 2021-01-03 DIAGNOSIS — I10 Essential (primary) hypertension: Secondary | ICD-10-CM | POA: Diagnosis not present

## 2021-01-03 DIAGNOSIS — Z1231 Encounter for screening mammogram for malignant neoplasm of breast: Secondary | ICD-10-CM

## 2021-01-03 DIAGNOSIS — R519 Headache, unspecified: Secondary | ICD-10-CM

## 2021-01-03 HISTORY — DX: Personal history of other diseases of the circulatory system: Z86.79

## 2021-01-03 MED ORDER — LISINOPRIL 20 MG PO TABS: 20.0000 mg | ORAL_TABLET | Freq: Every day | ORAL | 1 refills | Status: DC

## 2021-01-03 MED ORDER — ATORVASTATIN CALCIUM 40 MG PO TABS: ORAL_TABLET | ORAL | 1 refills | Status: DC

## 2021-01-03 MED ORDER — VITAMIN D (ERGOCALCIFEROL) 1.25 MG (50000 UNIT) PO CAPS: ORAL_CAPSULE | ORAL | 1 refills | Status: DC

## 2021-01-03 MED ORDER — METOPROLOL SUCCINATE ER 50 MG PO TB24: ORAL_TABLET | ORAL | 1 refills | Status: DC

## 2021-01-03 MED ORDER — METFORMIN HCL ER 500 MG PO TB24: ORAL_TABLET | ORAL | 1 refills | Status: DC

## 2021-01-03 MED ORDER — GLIPIZIDE ER 10 MG PO TB24
10.0000 mg | ORAL_TABLET | Freq: Every day | ORAL | 1 refills | Status: DC
Start: 2021-01-03 — End: 2021-03-07

## 2021-01-03 MED ORDER — AMOXICILLIN 500 MG PO CAPS: ORAL_CAPSULE | ORAL | 0 refills | Status: DC

## 2021-01-03 MED ORDER — AMLODIPINE BESYLATE 10 MG PO TABS: 10.0000 mg | ORAL_TABLET | Freq: Every day | ORAL | 1 refills | Status: DC

## 2021-01-03 NOTE — Progress Notes (Signed)
Subjective:    Patient ID: Denise Hill, female    DOB: 05-Jul-1961, 60 y.o.   MRN: [id number]  HPI Pt here for follow up. Pt had recent lab work done. See below  Pt states blood pressure has been doing ok.   Pt states she has headaches off and on.  Denies any vomiting blurred vision does not wake her up at night  Results for orders placed or performed in visit on 12/30/20  Comprehensive metabolic panel  Result Value Ref Range   Glucose 161 (H) 65 - 99 mg/dL   BUN 9 6 - 24 mg/dL   Creatinine, Ser 5.14 0.57 - 1.00 mg/dL   eGFR 87 >27 BL/TSL/1.43   BUN/Creatinine Ratio 11 9 - 23   Sodium 141 134 - 144 mmol/L   Potassium 4.7 3.5 - 5.2 mmol/L   Chloride 105 96 - 106 mmol/L   CO2 22 20 - 29 mmol/L   Calcium 9.6 8.7 - 10.2 mg/dL   Total Protein 6.9 6.0 - 8.5 g/dL   Albumin 4.2 3.8 - 4.9 g/dL   Globulin, Total 2.7 1.5 - 4.5 g/dL   Albumin/Globulin Ratio 1.6 1.2 - 2.2   Bilirubin Total 0.3 0.0 - 1.2 mg/dL   Alkaline Phosphatase 90 44 - 121 IU/L   AST 14 0 - 40 IU/L   ALT 11 0 - 32 IU/L  Lipid panel  Result Value Ref Range   Cholesterol, Total 249 (H) 100 - 199 mg/dL   Triglycerides 056 (H) 0 - 149 mg/dL   HDL 45 >28 mg/dL   VLDL Cholesterol Cal 31 5 - 40 mg/dL   LDL Chol Calc (NIH) 274 (H) 0 - 99 mg/dL   Chol/HDL Ratio 5.5 (H) 0.0 - 4.4 ratio  Hemoglobin A1c  Result Value Ref Range   Hgb A1c MFr Bld 8.3 (H) 4.8 - 5.6 %   Est. average glucose Bld gHb Est-mCnc 192 mg/dL  VITAMIN D 25 Hydroxy (Vit-D Deficiency, Fractures)  Result Value Ref Range   Vit D, 25-Hydroxy 19.9 (L) 30.0 - 100.0 ng/mL     Review of Systems Please see above.  Denies any chest pain shortness of breath    Objective:   Physical Exam lungs clear heart regular HEENT benign foot exam normal        Assessment & Plan:  1. Frequent headaches She describes these as intermittent headaches not severe occur maybe once a month or so she does have history of aneurysms but no vomiting no unilateral  numbness weakness just watch for now if she starts having headaches in the middle of the night or progressive tinnitus now she states that headaches are similar to how they have always been - Lipid Profile - Hemoglobin A1c - Basic Metabolic Panel (BMET)  2. History of cerebral aneurysm She states last time she had the scan they told her to do a follow-up in 5 years - Lipid Profile - Hemoglobin A1c - Basic Metabolic Panel (BMET)  3. Screening mammogram for breast cancer Mammogram recommended - MM 3D SCREEN BREAST BILATERAL - Lipid Profile - Hemoglobin A1c - Basic Metabolic Panel (BMET)  4. Essential hypertension, benign Blood pressure good control continue current measures - Lipid Profile - Hemoglobin A1c - Basic Metabolic Panel (BMET)  5. Type 2 diabetes mellitus with hyperglycemia, without long-term current use of insulin (HCC) Stay physically active watch portions try to lose weight A1c subpar control adjust medications if does not do better with this adjustment may well need  GLP 1 follow-up in 4 months to recheck A1c - Lipid Profile - Hemoglobin Y7S - Basic Metabolic Panel (BMET)  6. Hyperlipidemia, unspecified hyperlipidemia type Adjust medication get LDL below 70 watch diet stay active watch portions try to lose weight follow-up in 4 months - Lipid Profile - Hemoglobin W9T - Basic Metabolic Panel (BMET)

## 2021-01-13 ENCOUNTER — Ambulatory Visit (HOSPITAL_COMMUNITY)
Admission: RE | Admit: 2021-01-13 | Discharge: 2021-01-13 | Disposition: A | Discharge status: Home / Self Care | Payer: BC Managed Care – PPO | Source: Ambulatory Visit | Attending: Family Medicine | Admitting: Family Medicine

## 2021-01-13 DIAGNOSIS — Z1231 Encounter for screening mammogram for malignant neoplasm of breast: Secondary | ICD-10-CM | POA: Diagnosis not present

## 2021-01-27 ENCOUNTER — Telehealth: Payer: Self-pay | Admitting: Family Medicine

## 2021-01-27 NOTE — Telephone Encounter (Signed)
So it is important to make sure that there are no other symptoms going on  Is the numbness and tingling around the lips as in circumferential?  Or is it just unilateral? The patient should also in front of the mirror do the following Lift her eyebrows Smile And if possible have someone watch her shut her eyes  It is important that this is symmetrical As then making sure she does not have any unilateral symptoms  Also when did this began specifically?

## 2021-01-27 NOTE — Telephone Encounter (Signed)
Pt states the numbness and tingling is in the center of top lip, started this morning a few mins after taking her med. She states her smile and lifting eyebrows are symetrical. Not having any other symptoms. No symptoms on one side. She is not able to have anyone watch her close her eyes. States her symptoms are some better since this morning but can still feel tingling in her top lip in the center

## 2021-01-27 NOTE — Telephone Encounter (Signed)
Left message to return call 

## 2021-01-27 NOTE — Telephone Encounter (Signed)
This is a nonspecific finding-hard to know if this is related to her medications or just a coincidental occurrence.  I do not feel that this is a sign of a stroke.  If she started having one-sided numbness and weakness or one-sided weakness or difficulty speaking swallowing that could be an indication of potential stroke and ER visit would be the next step.  I do not feel the patient needs to go to the ER currently  I would recommend follow-up on Wednesday for a consultation with me (should have openings given that the meeting was canceled) Bring medications with her we can discuss further at that time if it gets worse before Wednesday we will stop lisinopril, call us back if problems

## 2021-01-27 NOTE — Telephone Encounter (Signed)
Discussed with pt. Pt verbalized understanding.  °

## 2021-01-27 NOTE — Telephone Encounter (Signed)
Pt calling in to report tingling in lip. Began this morning; no other symptoms. Pt states she heard something about Lisinopril could cause lip tingling. Pt states she has had recent med change with Metformin and Metoprolol. Please advise. Thank you

## 2021-01-29 ENCOUNTER — Ambulatory Visit: Payer: BC Managed Care – PPO | Admitting: Family Medicine

## 2021-03-06 ENCOUNTER — Other Ambulatory Visit: Payer: Self-pay | Admitting: Family Medicine

## 2021-05-06 ENCOUNTER — Encounter: Payer: Self-pay | Admitting: Family Medicine

## 2021-05-06 ENCOUNTER — Ambulatory Visit: Payer: BC Managed Care – PPO | Admitting: Family Medicine

## 2021-05-19 ENCOUNTER — Ambulatory Visit: Payer: BC Managed Care – PPO | Admitting: Family Medicine

## 2021-05-19 ENCOUNTER — Other Ambulatory Visit: Payer: Self-pay

## 2021-05-19 VITALS — BP 137/76 | HR 77 | Temp 97.2°F | Ht 67.5 in | Wt 222.0 lb

## 2021-05-19 DIAGNOSIS — R011 Cardiac murmur, unspecified: Secondary | ICD-10-CM

## 2021-05-19 DIAGNOSIS — E1169 Type 2 diabetes mellitus with other specified complication: Secondary | ICD-10-CM

## 2021-05-19 DIAGNOSIS — G4710 Hypersomnia, unspecified: Secondary | ICD-10-CM | POA: Diagnosis not present

## 2021-05-19 DIAGNOSIS — E1165 Type 2 diabetes mellitus with hyperglycemia: Secondary | ICD-10-CM

## 2021-05-19 DIAGNOSIS — E785 Hyperlipidemia, unspecified: Secondary | ICD-10-CM

## 2021-05-19 DIAGNOSIS — I10 Essential (primary) hypertension: Secondary | ICD-10-CM

## 2021-05-19 MED ORDER — METOPROLOL SUCCINATE ER 50 MG PO TB24: ORAL_TABLET | ORAL | 1 refills | Status: DC

## 2021-05-19 MED ORDER — GLIPIZIDE ER 10 MG PO TB24: ORAL_TABLET | ORAL | 1 refills | Status: DC

## 2021-05-19 MED ORDER — METFORMIN HCL ER 500 MG PO TB24: ORAL_TABLET | ORAL | 1 refills | Status: DC

## 2021-05-19 MED ORDER — LISINOPRIL 20 MG PO TABS: 20.0000 mg | ORAL_TABLET | Freq: Every day | ORAL | 1 refills | Status: DC

## 2021-05-19 MED ORDER — ATORVASTATIN CALCIUM 40 MG PO TABS: ORAL_TABLET | ORAL | 1 refills | Status: DC

## 2021-05-19 MED ORDER — AMLODIPINE BESYLATE 10 MG PO TABS: 10.0000 mg | ORAL_TABLET | Freq: Every day | ORAL | 1 refills | Status: DC

## 2021-05-19 NOTE — Progress Notes (Signed)
   Subjective:    Patient ID: Denise Hill, female    DOB: 1961-02-09, 60 y.o.   MRN: [id number]  Diabetes  Hypertension  The patient was seen today as part of a comprehensive diabetic check up. Patient has diabetes  Compliance-relates compliance with the diabetes diet for the most part Low sugars-tries to avoid low sugars denies any recently Dietary effort-dietary effort fairly well needs to do more exercise does not have as much time to do so Foot exam and ophthalmology exam requirements were reviewed  Patient for blood pressure check up.  The patient does have hypertension.    Medication compliance-relates compliance with medication  Blood pressure control recently-blood pressure overall decent control  Dietary compliance-minimizes salt Patient relates significant hypersomnolence over the past few months finds herself feeling fatigued tired sleepy during the day does not get sleepy with driving she does snore at night it but she does not know if she pauses breathing at nighttime.   Review of Systems     Objective:   Physical Exam  General-in no acute distress Eyes-no discharge Lungs-respiratory rate normal, CTA CV-mild systolic murmur noted,RRR Extremities skin warm dry no edema Neuro grossly normal Behavior normal, alert       Assessment & Plan:  1. Essential hypertension, benign Blood pressure good control continue current measures - Lipid panel - Hepatic function panel - Basic metabolic panel - Hemoglobin A1c - Microalbumin / creatinine urine ratio  2. Type 2 diabetes mellitus with hyperglycemia, without long-term current use of insulin (HCC) Diabetes hopefully under good control check lab work await results - Lipid panel - Hepatic function panel - Basic metabolic panel - Hemoglobin A1c - Microalbumin / creatinine urine ratio  3. Hypersomnolence Severe hypersomnolence recommend sleep study.  Suspect sleep apnea. - Ambulatory referral to Sleep  Studies  4. Hyperlipidemia associated with type 2 diabetes mellitus ([place]) Check lab work continue medication watch diet - Lipid panel - Hepatic function panel - Basic metabolic panel - Hemoglobin A1c - Microalbumin / creatinine urine ratio  5. Heart murmur Murmur detected check echo - ECHOCARDIOGRAM COMPLETE Follow-up in 6 months follow-up sooner depending on results of tests

## 2021-07-03 ENCOUNTER — Other Ambulatory Visit: Payer: Self-pay

## 2021-07-03 ENCOUNTER — Ambulatory Visit (HOSPITAL_COMMUNITY)
Admission: RE | Admit: 2021-07-03 | Discharge: 2021-07-03 | Disposition: A | Discharge status: Home / Self Care | Payer: BC Managed Care – PPO | Source: Ambulatory Visit | Attending: Family Medicine | Admitting: Family Medicine

## 2021-07-03 DIAGNOSIS — R011 Cardiac murmur, unspecified: Secondary | ICD-10-CM | POA: Insufficient documentation

## 2021-07-03 LAB — ECHOCARDIOGRAM COMPLETE
AR max vel: 2.53 cm2
AV Area VTI: 2.96 cm2
AV Area mean vel: 2.78 cm2
AV Mean grad: 5.1 mmHg
AV Peak grad: 11.3 mmHg
Ao pk vel: 1.68 m/s
Area-P 1/2: 3.31 cm2
S' Lateral: 2.8 cm

## 2021-07-03 NOTE — Progress Notes (Signed)
  Echocardiogram 2D Echocardiogram has been performed.  Denise Hill 07/03/2021, 3:32 PM

## 2021-07-17 DIAGNOSIS — I10 Essential (primary) hypertension: Secondary | ICD-10-CM | POA: Diagnosis not present

## 2021-07-17 DIAGNOSIS — E1169 Type 2 diabetes mellitus with other specified complication: Secondary | ICD-10-CM | POA: Diagnosis not present

## 2021-07-17 DIAGNOSIS — E785 Hyperlipidemia, unspecified: Secondary | ICD-10-CM | POA: Diagnosis not present

## 2021-07-17 DIAGNOSIS — E1165 Type 2 diabetes mellitus with hyperglycemia: Secondary | ICD-10-CM | POA: Diagnosis not present

## 2021-07-18 LAB — BASIC METABOLIC PANEL
BUN/Creatinine Ratio: 14 (ref 9–23)
BUN: 11 mg/dL (ref 6–24)
CO2: 25 mmol/L (ref 20–29)
Calcium: 9.8 mg/dL (ref 8.7–10.2)
Chloride: 105 mmol/L (ref 96–106)
Creatinine, Ser: 0.8 mg/dL (ref 0.57–1.00)
Glucose: 159 mg/dL — ABNORMAL HIGH (ref 70–99)
Potassium: 4.5 mmol/L (ref 3.5–5.2)
Sodium: 143 mmol/L (ref 134–144)
eGFR: 85 mL/min/{1.73_m2} (ref >59)

## 2021-07-18 LAB — LIPID PANEL
Chol/HDL Ratio: 3.6 ratio (ref 0.0–4.4)
Cholesterol, Total: 149 mg/dL (ref 100–199)
HDL: 41 mg/dL (ref >39)
LDL Chol Calc (NIH): 90 mg/dL (ref 0–99)
Triglycerides: 97 mg/dL (ref 0–149)
VLDL Cholesterol Cal: 18 mg/dL (ref 5–40)

## 2021-07-18 LAB — HEPATIC FUNCTION PANEL
ALT: 11 IU/L (ref 0–32)
AST: 11 IU/L (ref 0–40)
Albumin: 4.4 g/dL (ref 3.8–4.9)
Alkaline Phosphatase: 90 IU/L (ref 44–121)
Bilirubin Total: 0.3 mg/dL (ref 0.0–1.2)
Bilirubin, Direct: 0.1 mg/dL (ref 0.00–0.40)
Total Protein: 7.2 g/dL (ref 6.0–8.5)

## 2021-07-18 LAB — MICROALBUMIN / CREATININE URINE RATIO
Creatinine, Urine: 149.6 mg/dL
Microalb/Creat Ratio: 3 mg/g creat (ref 0–29)
Microalbumin, Urine: 4.5 ug/mL

## 2021-07-18 LAB — HEMOGLOBIN A1C
Est. average glucose Bld gHb Est-mCnc: 229 mg/dL
Hgb A1c MFr Bld: 9.6 % — ABNORMAL HIGH (ref 4.8–5.6)

## 2021-08-11 ENCOUNTER — Ambulatory Visit (INDEPENDENT_AMBULATORY_CARE_PROVIDER_SITE_OTHER): Payer: BC Managed Care – PPO | Admitting: Family Medicine

## 2021-08-11 ENCOUNTER — Other Ambulatory Visit: Payer: Self-pay

## 2021-08-11 VITALS — BP 122/82 | HR 78 | Temp 97.2°F | Ht 67.5 in | Wt 224.0 lb

## 2021-08-11 DIAGNOSIS — E785 Hyperlipidemia, unspecified: Secondary | ICD-10-CM

## 2021-08-11 DIAGNOSIS — E1165 Type 2 diabetes mellitus with hyperglycemia: Secondary | ICD-10-CM

## 2021-08-11 DIAGNOSIS — E1169 Type 2 diabetes mellitus with other specified complication: Secondary | ICD-10-CM

## 2021-08-11 DIAGNOSIS — Z23 Encounter for immunization: Secondary | ICD-10-CM | POA: Diagnosis not present

## 2021-08-11 DIAGNOSIS — I10 Essential (primary) hypertension: Secondary | ICD-10-CM | POA: Diagnosis not present

## 2021-08-11 MED ORDER — METFORMIN HCL ER 500 MG PO TB24: ORAL_TABLET | ORAL | 6 refills | Status: DC

## 2021-08-11 NOTE — Patient Instructions (Signed)
Diabetes Mellitus and Nutrition, Adult ?When you have diabetes, or diabetes mellitus, it is very important to have healthy eating habits because your blood sugar (glucose) levels are greatly affected by what you eat and drink. Eating healthy foods in the right amounts, at about the same times every day, can help you: ?Manage your blood glucose. ?Lower your risk of heart disease. ?Improve your blood pressure. ?Reach or maintain a healthy weight. ?What can affect my meal plan? ?Every person with diabetes is different, and each person has different needs for a meal plan. Your health care provider may recommend that you work with a dietitian to make a meal plan that is best for you. Your meal plan may vary depending on factors such as: ?The calories you need. ?The medicines you take. ?Your weight. ?Your blood glucose, blood pressure, and cholesterol levels. ?Your activity level. ?Other health conditions you have, such as heart or kidney disease. ?How do carbohydrates affect me? ?Carbohydrates, also called carbs, affect your blood glucose level more than any other type of food. Eating carbs raises the amount of glucose in your blood. ?It is important to know how many carbs you can safely have in each meal. This is different for every person. Your dietitian can help you calculate how many carbs you should have at each meal and for each snack. ?How does alcohol affect me? ?Alcohol can cause a decrease in blood glucose (hypoglycemia), especially if you use insulin or take certain diabetes medicines by mouth. Hypoglycemia can be a life-threatening condition. Symptoms of hypoglycemia, such as sleepiness, dizziness, and confusion, are similar to symptoms of having too much alcohol. ?Do not drink alcohol if: ?Your health care provider tells you not to drink. ?You are pregnant, may be pregnant, or are planning to become pregnant. ?If you drink alcohol: ?Limit how much you have to: ?0-1 drink a day for women. ?0-2 drinks a day  for men. ?Know how much alcohol is in your drink. In the [place] one drink equals one 12 oz bottle of beer (355 mL), one 5 oz glass of wine (148 mL), or one 1? oz glass of hard liquor (44 mL). ?Keep yourself hydrated with water, diet soda, or unsweetened iced tea. Keep in mind that regular soda, juice, and other mixers may contain a lot of sugar and must be counted as carbs. ?What are tips for following this plan? ?Reading food labels ?Start by checking the serving size on the Nutrition Facts label of packaged foods and drinks. The number of calories and the amount of carbs, fats, and other nutrients listed on the label are based on one serving of the item. Many items contain more than one serving per package. ?Check the total grams (g) of carbs in one serving. ?Check the number of grams of saturated fats and trans fats in one serving. Choose foods that have a low amount or none of these fats. ?Check the number of milligrams (mg) of salt (sodium) in one serving. Most people should limit total sodium intake to less than 2,300 mg per day. ?Always check the nutrition information of foods labeled as "low-fat" or "nonfat." These foods may be higher in added sugar or refined carbs and should be avoided. ?Talk to your dietitian to identify your daily goals for nutrients listed on the label. ?Shopping ?Avoid buying canned, pre-made, or processed foods. These foods tend to be high in fat, sodium, and added sugar. ?Shop around the outside edge of the grocery store. This is where you will   most often find fresh fruits and vegetables, bulk grains, fresh meats, and fresh dairy products. Cooking Use low-heat cooking methods, such as baking, instead of high-heat cooking methods, such as deep frying. Cook using healthy oils, such as olive, canola, or sunflower oil. Avoid cooking with butter, cream, or high-fat meats. Meal planning Eat meals and snacks regularly, preferably at the same times every day. Avoid going long periods of  time without eating. Eat foods that are high in fiber, such as fresh fruits, vegetables, beans, and whole grains. Eat 4-6 oz (112-168 g) of lean protein each day, such as lean meat, chicken, fish, eggs, or tofu. One ounce (oz) (28 g) of lean protein is equal to: 1 oz (28 g) of meat, chicken, or fish. 1 egg.  cup (62 g) of tofu. Eat some foods each day that contain healthy fats, such as avocado, nuts, seeds, and fish. What foods should I eat? Fruits Berries. Apples. Oranges. Peaches. Apricots. Plums. Grapes. Mangoes. Papayas. Pomegranates. Kiwi. Cherries. Vegetables Leafy greens, including lettuce, spinach, kale, chard, collard greens, mustard greens, and cabbage. Beets. Cauliflower. Broccoli. Carrots. Green beans. Tomatoes. Peppers. Onions. Cucumbers. Brussels sprouts. Grains Whole grains, such as whole-wheat or whole-grain bread, crackers, tortillas, cereal, and pasta. Unsweetened oatmeal. Quinoa. Brown or wild rice. Meats and other proteins Seafood. Poultry without skin. Lean cuts of poultry and beef. Tofu. Nuts. Seeds. Dairy Low-fat or fat-free dairy products such as milk, yogurt, and cheese. The items listed above may not be a complete list of foods and beverages you can eat and drink. Contact a dietitian for more information. What foods should I avoid? Fruits Fruits canned with syrup. Vegetables Canned vegetables. Frozen vegetables with butter or cream sauce. Grains Refined white flour and flour products such as bread, pasta, snack foods, and cereals. Avoid all processed foods. Meats and other proteins Fatty cuts of meat. Poultry with skin. Breaded or fried meats. Processed meat. Avoid saturated fats. Dairy Full-fat yogurt, cheese, or milk. Beverages Sweetened drinks, such as soda or iced tea. The items listed above may not be a complete list of foods and beverages you should avoid. Contact a dietitian for more information. Questions to ask a health care provider Do I need to  meet with a certified diabetes care and education specialist? Do I need to meet with a dietitian? What number can I call if I have questions? When are the best times to check my blood glucose? Where to find more information: American Diabetes Association: diabetes.org Academy of Nutrition and Dietetics: eatright.[employer] of Diabetes and Digestive and Kidney Diseases: [employer] Association of Diabetes Care & Education Specialists: diabeteseducator.org Summary It is important to have healthy eating habits because your blood sugar (glucose) levels are greatly affected by what you eat and drink. It is important to use alcohol carefully. A healthy meal plan will help you manage your blood glucose and lower your risk of heart disease. Your health care provider may recommend that you work with a dietitian to make a meal plan that is best for you. This information is not intended to replace advice given to you by your health care provider. Make sure you discuss any questions you have with your health care provider. Document Revised: 03/27/2020 Document Reviewed: 03/27/2020 Elsevier Patient Education  [place]. Steps to Quit Smoking Smoking tobacco is the leading cause of preventable death. It can affect almost every organ in the body. Smoking puts you and people around you at risk for many serious, long-lasting (chronic) diseases.  Quitting smoking can be hard, but it is one of the best things that you can do for your health. It is never too late to quit. How do I get ready to quit? When you decide to quit smoking, make a plan to help you succeed. Before you quit: Pick a date to quit. Set a date within the next 2 weeks to give you time to prepare. Write down the reasons why you are quitting. Keep this list in places where you will see it often. Tell your family, friends, and co-workers that you are quitting. Their support is important. Talk with your doctor about the choices  that may help you quit. Find out if your health insurance will pay for these treatments. Know the people, places, things, and activities that make you want to smoke (triggers). Avoid them. What first steps can I take to quit smoking? Throw away all cigarettes at home, at work, and in your car. Throw away the things that you use when you smoke, such as ashtrays and lighters. Clean your car. Make sure to empty the ashtray. Clean your home, including curtains and carpets. What can I do to help me quit smoking? Talk with your doctor about taking medicines and seeing a counselor at the same time. You are more likely to succeed when you do both. If you are pregnant or breastfeeding, talk with your doctor about counseling or other ways to quit smoking. Do not take medicine to help you quit smoking unless your doctor tells you to do so. To quit smoking: Quit right away Quit smoking totally, instead of slowly cutting back on how much you smoke over a period of time. Go to counseling. You are more likely to quit if you go to counseling sessions regularly. Take medicine You may take medicines to help you quit. Some medicines need a prescription, and some you can buy over-the-counter. Some medicines may contain a drug called nicotine to replace the nicotine in cigarettes. Medicines may: Help you to stop having the desire to smoke (cravings). Help to stop the problems that come when you stop smoking (withdrawal symptoms). Your doctor may ask you to use: Nicotine patches, gum, or lozenges. Nicotine inhalers or sprays. Non-nicotine medicine that is taken by mouth. Find resources Find resources and other ways to help you quit smoking and remain smoke-free after you quit. These resources are most helpful when you use them often. They include: Online chats with a Social worker. Phone quitlines. Printed Furniture conservator/restorer. Support groups or group counseling. Text messaging programs. Mobile phone apps. Use  apps on your mobile phone or tablet that can help you stick to your quit plan. There are many free apps for mobile phones and tablets as well as websites. Examples include Quit Guide from the [employer] and smokefree.gov  What things can I do to make it easier to quit?  Talk to your family and friends. Ask them to support and encourage you. Call a phone quitline (1-800-QUIT-NOW), reach out to support groups, or work with a Social worker. Ask people who smoke to not smoke around you. Avoid places that make you want to smoke, such as: Bars. Parties. Smoke-break areas at work. Spend time with people who do not smoke. Lower the stress in your life. Stress can make you want to smoke. Try these things to help your stress: Getting regular exercise. Doing deep-breathing exercises. Doing yoga. Meditating. Doing a body scan. To do this, close your eyes, focus on one area of your body at a  time from head to toe. Notice which parts of your body are tense. Try to relax the muscles in those areas. How will I feel when I quit smoking? Day 1 to 3 weeks Within the first 24 hours, you may start to have some problems that come from quitting tobacco. These problems are very bad 2-3 days after you quit, but they do not often last for more than 2-3 weeks. You may get these symptoms: Mood swings. Feeling restless, nervous, angry, or annoyed. Trouble concentrating. Dizziness. Strong desire for high-sugar foods and nicotine. Weight gain. Trouble pooping (constipation). Feeling like you may vomit (nausea). Coughing or a sore throat. Changes in how the medicines that you take for other issues work in your body. Depression. Trouble sleeping (insomnia). Week 3 and afterward After the first 2-3 weeks of quitting, you may start to notice more positive results, such as: Better sense of smell and taste. Less coughing and sore throat. Slower heart rate. Lower blood pressure. Clearer skin. Better breathing. Fewer sick  days. Quitting smoking can be hard. Do not give up if you fail the first time. Some people need to try a few times before they succeed. Do your best to stick to your quit plan, and talk with your doctor if you have any questions or concerns. Summary Smoking tobacco is the leading cause of preventable death. Quitting smoking can be hard, but it is one of the best things that you can do for your health. When you decide to quit smoking, make a plan to help you succeed. Quit smoking right away, not slowly over a period of time. When you start quitting, seek help from your doctor, family, or friends. This information is not intended to replace advice given to you by your health care provider. Make sure you discuss any questions you have with your health care provider. Document Revised: 05/02/2021 Document Reviewed: 11/12/2018 Elsevier Patient Education  [place].

## 2021-08-11 NOTE — Progress Notes (Signed)
   Subjective:    Patient ID: Denise Hill, female    DOB: 02-Jul-1961, 60 y.o.   MRN: [id number]  HPI Diabetes follow up currently taking metformin and glipizide  HTN follow up  She relates she could be doing better with her diet She does stay compliant with medicine Stays active at work but does not do any exercise outside of that Carrying extra weight as well No chest pain shortness of breath  Review of Systems     Objective:   Physical Exam  General-in no acute distress Eyes-no discharge Lungs-respiratory rate normal, CTA CV-no murmurs,RRR Extremities skin warm dry no edema Neuro grossly normal Behavior normal, alert  Labs reviewed with patient A1c not at goal we did discuss dietary measures increasing metformin we also discussed GLP-1     Assessment & Plan:  1. Essential hypertension, benign Blood pressure decent control continue current measures.  Watch diet closely - Hemoglobin A1c - Lipid panel - Basic metabolic panel  2. Hyperlipidemia associated with type 2 diabetes mellitus ([place]) Continue healthy diet continue medication.  Check lipid profile before next visit - Hemoglobin A1c - Lipid panel - Basic metabolic panel  3. Type 2 diabetes mellitus with hyperglycemia, without long-term current use of insulin ([facility]) We did discuss GLP-1 both Rybelsus as well as injectables she prefers pill form but currently right now noncommittal we will discuss this further when she follows up - Hemoglobin A1c - Lipid panel - Basic metabolic panel  4. Immunization due Vaccine today - Pneumococcal conjugate vaccine 20-valent (Prevnar 20) - Flu Vaccine QUAD 55mo+IM (Fluarix, Fluzone & Alfiuria Quad PF)  She would benefit from GLP-1 for A1c and weight reduction. Healthy diet recommended.  Continue current medication. Check labs before next visit in 3 months may need addition of GLP-1

## 2021-11-11 ENCOUNTER — Ambulatory Visit: Payer: BC Managed Care – PPO | Admitting: Family Medicine

## 2021-11-18 ENCOUNTER — Ambulatory Visit: Payer: BC Managed Care – PPO | Admitting: Family Medicine

## 2021-11-21 DIAGNOSIS — E785 Hyperlipidemia, unspecified: Secondary | ICD-10-CM | POA: Diagnosis not present

## 2021-11-21 DIAGNOSIS — I10 Essential (primary) hypertension: Secondary | ICD-10-CM | POA: Diagnosis not present

## 2021-11-21 DIAGNOSIS — E1169 Type 2 diabetes mellitus with other specified complication: Secondary | ICD-10-CM | POA: Diagnosis not present

## 2021-11-21 DIAGNOSIS — E1165 Type 2 diabetes mellitus with hyperglycemia: Secondary | ICD-10-CM | POA: Diagnosis not present

## 2021-11-22 LAB — BASIC METABOLIC PANEL
BUN/Creatinine Ratio: 11 (ref 9–23)
BUN: 8 mg/dL (ref 6–24)
CO2: 24 mmol/L (ref 20–29)
Calcium: 9.7 mg/dL (ref 8.7–10.2)
Chloride: 105 mmol/L (ref 96–106)
Creatinine, Ser: 0.74 mg/dL (ref 0.57–1.00)
Glucose: 153 mg/dL — ABNORMAL HIGH (ref 70–99)
Potassium: 4.1 mmol/L (ref 3.5–5.2)
Sodium: 142 mmol/L (ref 134–144)
eGFR: 94 mL/min/{1.73_m2} (ref >59)

## 2021-11-22 LAB — LIPID PANEL
Chol/HDL Ratio: 5 ratio — ABNORMAL HIGH (ref 0.0–4.4)
Cholesterol, Total: 211 mg/dL — ABNORMAL HIGH (ref 100–199)
HDL: 42 mg/dL (ref >39)
LDL Chol Calc (NIH): 129 mg/dL — ABNORMAL HIGH (ref 0–99)
Triglycerides: 222 mg/dL — ABNORMAL HIGH (ref 0–149)
VLDL Cholesterol Cal: 40 mg/dL (ref 5–40)

## 2021-11-22 LAB — HEMOGLOBIN A1C
Est. average glucose Bld gHb Est-mCnc: 200 mg/dL
Hgb A1c MFr Bld: 8.6 % — ABNORMAL HIGH (ref 4.8–5.6)

## 2021-11-26 ENCOUNTER — Other Ambulatory Visit: Payer: Self-pay

## 2021-11-26 ENCOUNTER — Ambulatory Visit: Payer: BC Managed Care – PPO | Admitting: Family Medicine

## 2021-11-26 VITALS — BP 132/82

## 2021-11-26 DIAGNOSIS — E785 Hyperlipidemia, unspecified: Secondary | ICD-10-CM

## 2021-11-26 DIAGNOSIS — E1165 Type 2 diabetes mellitus with hyperglycemia: Secondary | ICD-10-CM | POA: Diagnosis not present

## 2021-11-26 DIAGNOSIS — I10 Essential (primary) hypertension: Secondary | ICD-10-CM

## 2021-11-26 DIAGNOSIS — E1169 Type 2 diabetes mellitus with other specified complication: Secondary | ICD-10-CM

## 2021-11-26 MED ORDER — LISINOPRIL 20 MG PO TABS: 20.0000 mg | ORAL_TABLET | Freq: Every day | ORAL | 1 refills | Status: DC

## 2021-11-26 MED ORDER — METFORMIN HCL ER 500 MG PO TB24: ORAL_TABLET | ORAL | 6 refills | Status: DC

## 2021-11-26 MED ORDER — AMLODIPINE BESYLATE 10 MG PO TABS: 10.0000 mg | ORAL_TABLET | Freq: Every day | ORAL | 1 refills | Status: DC

## 2021-11-26 MED ORDER — GLIPIZIDE ER 10 MG PO TB24: ORAL_TABLET | ORAL | 1 refills | Status: DC

## 2021-11-26 MED ORDER — ATORVASTATIN CALCIUM 40 MG PO TABS: ORAL_TABLET | ORAL | 1 refills | Status: DC

## 2021-11-26 MED ORDER — METOPROLOL SUCCINATE ER 50 MG PO TB24: ORAL_TABLET | ORAL | 1 refills | Status: DC

## 2021-11-26 MED ORDER — TIRZEPATIDE 2.5 MG/0.5ML ~~LOC~~ SOAJ: 2.5000 mg | SUBCUTANEOUS | 0 refills | Status: DC

## 2021-11-26 NOTE — Progress Notes (Signed)
? ?  Subjective:  ? ? Patient ID: Denise Hill, female    DOB: [birth year], 61 y.o.   MRN: [id number] ? ?Diabetes ?She presents for her follow-up diabetic visit. She has type 2 diabetes mellitus. Current diabetic treatments: metformin, glipizide.  ?Hypertension ?This is a chronic problem. The problem is controlled. Treatments tried: amlodipine, lisinopril, metoprolol.  ?.thius ?Results for orders placed or performed in visit on 08/11/21  ?Hemoglobin A1c  ?Result Value Ref Range  ? Hgb A1c MFr Bld 8.6 (H) 4.8 - 5.6 %  ? Est. average glucose Bld gHb Est-mCnc 200 mg/dL  ?Lipid panel  ?Result Value Ref Range  ? Cholesterol, Total 211 (H) 100 - 199 mg/dL  ? Triglycerides 222 (H) 0 - 149 mg/dL  ? HDL 42 >39 mg/dL  ? VLDL Cholesterol Cal 40 5 - 40 mg/dL  ? LDL Chol Calc (NIH) 129 (H) 0 - 99 mg/dL  ? Chol/HDL Ratio 5.0 (H) 0.0 - 4.4 ratio  ?Basic metabolic panel  ?Result Value Ref Range  ? Glucose 153 (H) 70 - 99 mg/dL  ? BUN 8 6 - 24 mg/dL  ? Creatinine, Ser 0.74 0.57 - 1.00 mg/dL  ? eGFR 94 >59 mL/min/1.73  ? BUN/Creatinine Ratio 11 9 - 23  ? Sodium 142 134 - 144 mmol/L  ? Potassium 4.1 3.5 - 5.2 mmol/L  ? Chloride 105 96 - 106 mmol/L  ? CO2 24 20 - 29 mmol/L  ? Calcium 9.7 8.7 - 10.2 mg/dL  ? ?Labs do not look good.  LDL above goal also diet is not as good as it should be.  And A1c elevated. ? ? ?Review of Systems ? ?   ?Objective:  ? Physical Exam ?General-in no acute distress ?Eyes-no discharge ?Lungs-respiratory rate normal, CTA ?CV-no murmurs,RRR ?Extremities skin warm dry no edema ?Neuro grossly normal ?Behavior normal, alert ? ? ? ? ?   ?Assessment & Plan:  ?Patient advised to get yearly eye exam ?Poor control diabetes ?We discussed multiple options ?Discussed healthier eating ?Printed information given ?Based upon all the different options patient chooses GLP-1 ?Side effects was printed out and reviewed with patient in detail ?She has no history of thyroid cancer or family history of thyroid cancer ?She understands  if she gets severe abdominal pain vomiting stop medication notify us right away ?She is aware of the risk of pancreatitis as well as the risk of suicidal ideation ?We will start off Mounjaro 2.5 mg weekly ?Recommend patient give [place] update within 3 to 4 weeks ?Bump up the dose gradually over the next 3 months ?Healthier eating regular activity ?Encourage compliance with statins which I do not feel she is being based upon pharmacy [place] ?Recheck labs in 3 months with follow-up office visit. ? ?

## 2021-11-26 NOTE — Patient Instructions (Signed)
Discussion of GLP-1 medications Please remember these medications are to assist with weight reduction and diabetes control.  They do not take the place of healthy eating and regular physical activity. There are several GLP-1 medications that can help with weight reduction and diabetes control.  GLP-1 medications with indication for diabetes-Trulicity, Victoza, Bydureon , Mounjaro, Ozempic, and Rybelsus (although these are injected medicines except for Rybelsus which is a pill) GLP-1 medications with indications for weight loss-Wegovy, and Saxenda  Mechanism of action  These medications stimulate glucagon peptide receptors.  By doing so it does the following:  1-slows down stomach emptying-essentially food takes longer to go through the stomach and the intestines-this can lessen appetite  2-reduces glucagon secretion from the pancreas-this helps keep blood glucose levels stable between meals  3-increase his insulin release from the pancreas-with diabetics this helps keep blood glucose levels stable  4-promotes the feeling of being full in the brain-encouraged him receptors in the brain received the signal so the brain and body know that it is time to stop eating Benefits of the medication  1-reduced weight-reduction of weight is more significant at higher dosing.  Not as much weight reduction with Rybelsus   A- typically Wegovy at higher doses can assist with significant weight reduction.  2-improved blood glucose levels-with diabetics typically we see a significant drop with A1c when the medication is adjusted appropriately.  3-decrease risk of heart attacks and strokes-individuals with type 2 diabetes are at increased risk of heart attacks and strokes.  These GLP-1 medications can reduce the risk by 22%.  Risk of GLP-1 medications-these are some of the risks.  It is important to talk with your provider in a shared discussion before starting any medication.  Contraindications to GLP-1  medications-in other words who should not take these.  1-individuals with history of thyroid medullary cancer  2-individuals with family history of multiple endocrine neoplasm syndrome type II (MEN 2)  3-individuals with a history of pancreatitis  4-those with a history of severe hypersensitivity or allergy reactions to GLP-1 medications  5-should be avoided in individuals with a history of suicidal attempts or active suicidal ideation.  Cautions include- 1- risk of thyroid C-cell tumors were seen in rats during clinical testing in humans the relevancy of this information has not been determined 2-risk of pancreatitis including fatal and nonfatal hemorrhagic or necrotizing pancreatitis 3-gallbladder disease slightly increased risk of gallstones 4-hypoglycemia-more common with individuals who are on insulin or sulfonylurea such as glipizide 5-acute kidney injury or worsening of chronic renal failure often this is triggered by severe vomiting and diarrhea as side effects to GLP-1. 6-slightly increased risk of diabetic retinopathy complications in patients with type 2 diabetes 7-heart rate increase-slight increase of base heart rate with GLP-1 8-suicidal behavior and ideation has been reported in clinical trials with other weight loss medicines.  If depression occurs with medication or suicidal ideation stop medication immediately notify provider or seek help.  Common side effects include nausea, vomiting, diarrhea, constipation and increased heart rate.  Less common side effects severe abdominal pain-if this occurs notify provider stop medication get tested for pancreatitis. Full discussion with your provider should occur before starting these medicines.       

## 2021-12-05 ENCOUNTER — Telehealth: Payer: Self-pay | Admitting: Family Medicine

## 2021-12-05 MED ORDER — OZEMPIC (0.25 OR 0.5 MG/DOSE) 2 MG/1.5ML ~~LOC~~ SOPN: PEN_INJECTOR | SUBCUTANEOUS | 0 refills | Status: DC

## 2021-12-05 NOTE — Telephone Encounter (Signed)
So in this situation I recommend Ozempic. ?Ozempic 0.5 but starting off for the first 4 weeks will be 0.25 once per week then after that it would be 0.5 once a week for the following 4 weeks.  Then depending on how she is doing we may well be going up on the dose. ?Thank you ?

## 2021-12-05 NOTE — Telephone Encounter (Signed)
Pt contacted and verbalized understanding. Ozempic sent to pharmacy and pt is aware.  ?

## 2021-12-05 NOTE — Addendum Note (Signed)
Addended by: Vicente Males on: 12/05/2021 01:48 PM ? ? Modules accepted: Orders ? ?

## 2021-12-05 NOTE — Telephone Encounter (Signed)
Pt received her denial letter for [facility]. Pt states on the denial letter it states that alternatives include Ozempic, Rybelsus, Trulicity, Victoza. Pt states she does not want to do insulin and would prefer the once weekly shot. Please advise. Thank you.  ?

## 2022-01-31 ENCOUNTER — Other Ambulatory Visit: Payer: Self-pay | Admitting: Family Medicine

## 2022-01-31 DIAGNOSIS — E1165 Type 2 diabetes mellitus with hyperglycemia: Secondary | ICD-10-CM

## 2022-02-03 NOTE — Telephone Encounter (Signed)
If she is tolerating the dose well I would recommend bumping it up to 0.5 weekly She may have a 30-day with an additional month Needs to do lab work either in late June or early July with a follow-up visit by no later than July thank you

## 2022-02-03 NOTE — Telephone Encounter (Signed)
Patient says that she has tolerated the 0.25 mg, she does get heartburn at times. Appointment made for 03/09/22 at 8am.

## 2022-02-04 NOTE — Telephone Encounter (Signed)
I would recommend that she go up to 0.5 mg weekly she may have 1 month with 2 additional refills she should keep follow-up visit (If she does not have lab work ordered she should do A1c, lipid, liver, metabolic 7 and if she has not done a urine ACR in the past 6 months please order)

## 2022-02-05 ENCOUNTER — Telehealth: Payer: Self-pay

## 2022-02-05 NOTE — Telephone Encounter (Addendum)
Message left for patient to inform per drs orders   Patient returned call and informed per drs notes I would recommend that she go up to 0.5 mg weekly she may have 1 month with 2 additional refills she should keep follow-up visit (If she does not have lab work ordered she should do A1c, lipid, liver, metabolic 7 and if she has not done a urine ACR in the past 6 months please order

## 2022-02-26 ENCOUNTER — Ambulatory Visit: Payer: BC Managed Care – PPO | Admitting: Family Medicine

## 2022-03-04 DIAGNOSIS — I10 Essential (primary) hypertension: Secondary | ICD-10-CM | POA: Diagnosis not present

## 2022-03-04 DIAGNOSIS — E1165 Type 2 diabetes mellitus with hyperglycemia: Secondary | ICD-10-CM | POA: Diagnosis not present

## 2022-03-04 DIAGNOSIS — E1169 Type 2 diabetes mellitus with other specified complication: Secondary | ICD-10-CM | POA: Diagnosis not present

## 2022-03-04 DIAGNOSIS — E785 Hyperlipidemia, unspecified: Secondary | ICD-10-CM | POA: Diagnosis not present

## 2022-03-05 LAB — BASIC METABOLIC PANEL
BUN/Creatinine Ratio: 7 — ABNORMAL LOW (ref 9–23)
BUN: 6 mg/dL (ref 6–24)
CO2: 25 mmol/L (ref 20–29)
Calcium: 9.7 mg/dL (ref 8.7–10.2)
Chloride: 104 mmol/L (ref 96–106)
Creatinine, Ser: 0.81 mg/dL (ref 0.57–1.00)
Glucose: 108 mg/dL — ABNORMAL HIGH (ref 70–99)
Potassium: 4.7 mmol/L (ref 3.5–5.2)
Sodium: 141 mmol/L (ref 134–144)
eGFR: 84 mL/min/{1.73_m2} (ref >59)

## 2022-03-05 LAB — HEPATIC FUNCTION PANEL
ALT: 14 IU/L (ref 0–32)
AST: 14 IU/L (ref 0–40)
Albumin: 4.3 g/dL (ref 3.8–4.9)
Alkaline Phosphatase: 91 IU/L (ref 44–121)
Bilirubin Total: 0.3 mg/dL (ref 0.0–1.2)
Bilirubin, Direct: <0.1 mg/dL (ref 0.00–0.40)
Total Protein: 7.1 g/dL (ref 6.0–8.5)

## 2022-03-05 LAB — LIPID PANEL
Chol/HDL Ratio: 3.6 ratio (ref 0.0–4.4)
Cholesterol, Total: 152 mg/dL (ref 100–199)
HDL: 42 mg/dL (ref >39)
LDL Chol Calc (NIH): 82 mg/dL (ref 0–99)
Triglycerides: 159 mg/dL — ABNORMAL HIGH (ref 0–149)
VLDL Cholesterol Cal: 28 mg/dL (ref 5–40)

## 2022-03-05 LAB — HEMOGLOBIN A1C
Est. average glucose Bld gHb Est-mCnc: 140 mg/dL
Hgb A1c MFr Bld: 6.5 % — ABNORMAL HIGH (ref 4.8–5.6)

## 2022-03-09 ENCOUNTER — Ambulatory Visit: Payer: BC Managed Care – PPO | Admitting: Family Medicine

## 2022-03-09 ENCOUNTER — Encounter: Payer: Self-pay | Admitting: Family Medicine

## 2022-03-09 VITALS — BP 122/83 | Temp 97.2°F | Wt 212.8 lb

## 2022-03-09 DIAGNOSIS — E1165 Type 2 diabetes mellitus with hyperglycemia: Secondary | ICD-10-CM

## 2022-03-09 DIAGNOSIS — E1169 Type 2 diabetes mellitus with other specified complication: Secondary | ICD-10-CM | POA: Diagnosis not present

## 2022-03-09 DIAGNOSIS — I10 Essential (primary) hypertension: Secondary | ICD-10-CM

## 2022-03-09 DIAGNOSIS — E669 Obesity, unspecified: Secondary | ICD-10-CM | POA: Diagnosis not present

## 2022-03-09 DIAGNOSIS — E785 Hyperlipidemia, unspecified: Secondary | ICD-10-CM

## 2022-03-09 MED ORDER — AMLODIPINE BESYLATE 5 MG PO TABS: 5.0000 mg | ORAL_TABLET | Freq: Every day | ORAL | 1 refills | Status: DC

## 2022-03-09 MED ORDER — METFORMIN HCL ER 500 MG PO TB24: ORAL_TABLET | ORAL | 5 refills | Status: DC

## 2022-03-09 MED ORDER — SEMAGLUTIDE (1 MG/DOSE) 4 MG/3ML ~~LOC~~ SOPN: 1.0000 mg | PEN_INJECTOR | SUBCUTANEOUS | 4 refills | Status: DC

## 2022-03-09 NOTE — Progress Notes (Unsigned)
   Subjective:    Patient ID: Chester Holstein, female    DOB: Oct 30, 1960, 61 y.o.   MRN: [id number]  HPI Pt here for follow up on HTN and DM. Pt states she has had some diarrhea. Pt states diarrhea comes and goes. Pt doing well on Ozempic.  We reviewed her of her lab work We also talked about side effects hard to know if this is due to [place] or due to her metformin We did discuss how as her A1c gets better we can taper down on medications She is trying to work hard on diet We also talked about fitting and walking for exercise Also talked about yearly eye exams  Review of Systems     Objective:   Physical Exam  General-in no acute distress Eyes-no discharge Lungs-respiratory rate normal, CTA CV-no murmurs,RRR Extremities skin warm dry no edema Neuro grossly normal Behavior normal, alert  Labs reviewed     Assessment & Plan:  1. Essential hypertension, benign Blood pressure decent control continue current measures reduce amlodipine to 5 mg  2. Type 2 diabetes mellitus with hyperglycemia, without long-term current use of insulin (HCC) A1c much better control bump up dose of Ozempic to 1 mg reduce metformin make it available to 500 mg extended release 2 tablets each morning stop glipizide if things going well in several weeks to visit family just 1 tablet  3. Hyperlipidemia associated with type 2 diabetes mellitus ([place]) Continue cholesterol medicine  4. Obesity (BMI 30.0-34.9) Portion control regular activity walking  Lab work in 3 to 4 months with follow-up office visit

## 2022-03-09 NOTE — Patient Instructions (Signed)
Hi [place]  Increase dose of Ozempic to 1 mg on next refill Reduce metformin to 1 or 2 in the morning Reduce amlodipine to 5 mg- new dose sent in Call or send [place] message if problems Follow up in November

## 2022-03-09 NOTE — Progress Notes (Signed)
Prescription sent electronically to pharmacy. 

## 2022-07-13 ENCOUNTER — Ambulatory Visit: Payer: BC Managed Care – PPO | Admitting: Family Medicine

## 2022-08-24 ENCOUNTER — Telehealth: Payer: Self-pay | Admitting: Family Medicine

## 2022-08-24 NOTE — Telephone Encounter (Signed)
Patient is requesting prescription for medication to help her stop smoking again.Denise Hill

## 2022-08-24 NOTE — Telephone Encounter (Signed)
Nurses Previously she had utilized Chantix to help her quit smoking I am okay with reassuring this a couple questions 1.  With Chantix it is important to be aware that Chantix in some individuals can trigger depression and if depression symptoms more final ideation occur immediately stop the medicine #2 do it is important to verify with the patient that she is not currently depressed #3 after the first 2 questions-if she is not depressed you may send in starter pack of Chantix followed with maintenance pack for 8 weeks And a follow-up office visit in January as already planned and scheduled please keep this appointment

## 2022-08-25 NOTE — Telephone Encounter (Signed)
My chart message sent to patient. Tried to connect via phone but messages stated "your call can not be completed at this time".

## 2022-08-27 NOTE — Telephone Encounter (Signed)
Attempted to contact patient-phone rings but then says "Call can not be completed at this time"

## 2022-09-17 NOTE — Telephone Encounter (Signed)
Call unable to be completed. Will close message-pt has appt on 09/22/22

## 2022-09-21 ENCOUNTER — Other Ambulatory Visit: Payer: Self-pay

## 2022-09-21 MED ORDER — VARENICLINE TARTRATE 1 MG PO TABS: 1.0000 mg | ORAL_TABLET | Freq: Two times a day (BID) | ORAL | 0 refills | Status: DC

## 2022-09-21 MED ORDER — VARENICLINE TARTRATE (STARTER) 0.5 MG X 11 & 1 MG X 42 PO TBPK: ORAL_TABLET | ORAL | 0 refills | Status: DC

## 2022-09-22 ENCOUNTER — Ambulatory Visit: Payer: BC Managed Care – PPO | Admitting: Family Medicine

## 2022-09-22 ENCOUNTER — Encounter: Payer: Self-pay | Admitting: Family Medicine

## 2022-09-22 VITALS — BP 130/88 | HR 80 | Temp 98.4°F | Ht 67.5 in | Wt 217.6 lb

## 2022-09-22 DIAGNOSIS — I10 Essential (primary) hypertension: Secondary | ICD-10-CM

## 2022-09-22 DIAGNOSIS — Z23 Encounter for immunization: Secondary | ICD-10-CM

## 2022-09-22 DIAGNOSIS — M79672 Pain in left foot: Secondary | ICD-10-CM

## 2022-09-22 DIAGNOSIS — E1169 Type 2 diabetes mellitus with other specified complication: Secondary | ICD-10-CM | POA: Diagnosis not present

## 2022-09-22 DIAGNOSIS — E1165 Type 2 diabetes mellitus with hyperglycemia: Secondary | ICD-10-CM

## 2022-09-22 DIAGNOSIS — E785 Hyperlipidemia, unspecified: Secondary | ICD-10-CM

## 2022-09-22 MED ORDER — AMLODIPINE BESYLATE 5 MG PO TABS: 5.0000 mg | ORAL_TABLET | Freq: Every day | ORAL | 1 refills | Status: DC

## 2022-09-22 MED ORDER — METFORMIN HCL ER 500 MG PO TB24: ORAL_TABLET | ORAL | 5 refills | Status: DC

## 2022-09-22 MED ORDER — ATORVASTATIN CALCIUM 40 MG PO TABS: ORAL_TABLET | ORAL | 1 refills | Status: DC

## 2022-09-22 MED ORDER — LISINOPRIL 20 MG PO TABS: 20.0000 mg | ORAL_TABLET | Freq: Every day | ORAL | 1 refills | Status: DC

## 2022-09-22 MED ORDER — METOPROLOL SUCCINATE ER 50 MG PO TB24: ORAL_TABLET | ORAL | 1 refills | Status: DC

## 2022-09-22 NOTE — Progress Notes (Signed)
   Subjective:    Patient ID: Denise Hill, female    DOB: 02/22/1961, 62 y.o.   MRN: [id number]  HPI Patient arrives for 4 mouth follow up on BP and diabetes.  Patient no concerns or issues today.  The patient was seen today as part of a comprehensive diabetic check up. Patient has diabetes Patient relates good compliance with taking the medication. We discussed their diet and exercise activities  We also discussed the importance of notifying us if any excessively high glucoses or low sugars.   Patient for blood pressure check up.  The patient does have hypertension.   Patient relates dietary measures try to minimize salt The importance of healthy diet and activity were discussed Patient relates compliance  Patient here for follow-up regarding cholesterol.    Patient relates taking medication on a regular basis Denies problems with medication Importance of dietary measures discussed Regular lab work regarding lipid and liver was checked and if needing additional labs was appropriately ordered      Review of Systems     Objective:   Physical Exam  General-in no acute distress Eyes-no discharge Lungs-respiratory rate normal, CTA CV-no murmurs,RRR Extremities skin warm dry no edema Neuro grossly normal Behavior normal, alert       Assessment & Plan:  1. Type 2 diabetes mellitus with hyperglycemia, without long-term current use of insulin ([place]) She has not done her blood work She did not tolerate Ozempic She will do her lab work and we will make any changes after all of that  - Hemoglobin Q4B - Basic Metabolic Panel (7) - Hepatic Function Panel - Microalbumin/Creatinine Ratio, Urine  2. Hyperlipidemia associated with type 2 diabetes mellitus ([place]) Continue statin Watch diet stay active recheck lab work  - Hemoglobin A1c - Lipid panel - Hepatic Function Panel  3. Essential hypertension, benign Blood pressure decent control could be better she needs to  incorporate walking portion control trying to lose weight and also quitting smoking.  4. Left foot pain Left foot pain more than likely a neuroma patient defers on podiatry referral  5. Need for vaccination Flu shot - Flu Vaccine QUAD 6+ mos PF IM (Fluarix Quad PF)  Follow-up in late May early June

## 2022-09-23 ENCOUNTER — Encounter: Payer: Self-pay | Admitting: Family Medicine

## 2022-10-20 DIAGNOSIS — E1169 Type 2 diabetes mellitus with other specified complication: Secondary | ICD-10-CM | POA: Diagnosis not present

## 2022-10-20 DIAGNOSIS — E785 Hyperlipidemia, unspecified: Secondary | ICD-10-CM | POA: Diagnosis not present

## 2022-10-20 DIAGNOSIS — E1165 Type 2 diabetes mellitus with hyperglycemia: Secondary | ICD-10-CM | POA: Diagnosis not present

## 2022-10-21 LAB — BASIC METABOLIC PANEL (7)
BUN/Creatinine Ratio: 15 (ref 9–23)
BUN: 11 mg/dL (ref 6–24)
CO2: 24 mmol/L (ref 20–29)
Chloride: 104 mmol/L (ref 96–106)
Creatinine, Ser: 0.71 mg/dL (ref 0.57–1.00)
Glucose: 160 mg/dL — ABNORMAL HIGH (ref 70–99)
Potassium: 4.8 mmol/L (ref 3.5–5.2)
Sodium: 142 mmol/L (ref 134–144)
eGFR: 98 mL/min/{1.73_m2} (ref >59)

## 2022-10-21 LAB — HEPATIC FUNCTION PANEL
ALT: 14 IU/L (ref 0–32)
AST: 13 IU/L (ref 0–40)
Albumin: 4.4 g/dL (ref 3.8–4.9)
Alkaline Phosphatase: 105 IU/L (ref 44–121)
Bilirubin Total: <0.2 mg/dL (ref 0.0–1.2)
Bilirubin, Direct: <0.1 mg/dL (ref 0.00–0.40)
Total Protein: 7 g/dL (ref 6.0–8.5)

## 2022-10-21 LAB — HEMOGLOBIN A1C
Est. average glucose Bld gHb Est-mCnc: 194 mg/dL
Hgb A1c MFr Bld: 8.4 % — ABNORMAL HIGH (ref 4.8–5.6)

## 2022-10-21 LAB — LIPID PANEL
Chol/HDL Ratio: 3.4 ratio (ref 0.0–4.4)
Cholesterol, Total: 170 mg/dL (ref 100–199)
HDL: 50 mg/dL (ref >39)
LDL Chol Calc (NIH): 98 mg/dL (ref 0–99)
Triglycerides: 124 mg/dL (ref 0–149)
VLDL Cholesterol Cal: 22 mg/dL (ref 5–40)

## 2022-10-21 LAB — MICROALBUMIN / CREATININE URINE RATIO
Creatinine, Urine: 74.4 mg/dL
Microalb/Creat Ratio: <4 mg/g creat (ref 0–29)
Microalbumin, Urine: <3 ug/mL

## 2022-11-02 NOTE — Addendum Note (Signed)
Addended by: Dairl Ponder on: 11/02/2022 03:53 PM   Modules accepted: Orders

## 2022-11-25 ENCOUNTER — Telehealth: Payer: Self-pay | Admitting: Family Medicine

## 2022-11-25 NOTE — Telephone Encounter (Signed)
Front Please reach out to the patient She is due for his standard follow-up visit in early July. Please go ahead and schedule her Her lab work is already ordered

## 2023-03-02 IMAGING — MG MM DIGITAL SCREENING BILAT W/ TOMO AND CAD
6 of 12 series · 6 of 36 positions shown · non-contrast
Comparison: Previous exam(s).

CLINICAL DATA: Screening.

EXAM:
DIGITAL SCREENING BILATERAL MAMMOGRAM WITH TOMOSYNTHESIS AND CAD
TECHNIQUE: Bilateral screening digital craniocaudal and mediolateral oblique
mammograms were obtained. Bilateral screening digital breast
tomosynthesis was performed. The images were evaluated with
computer-aided detection.

[L MLO synth-2D]
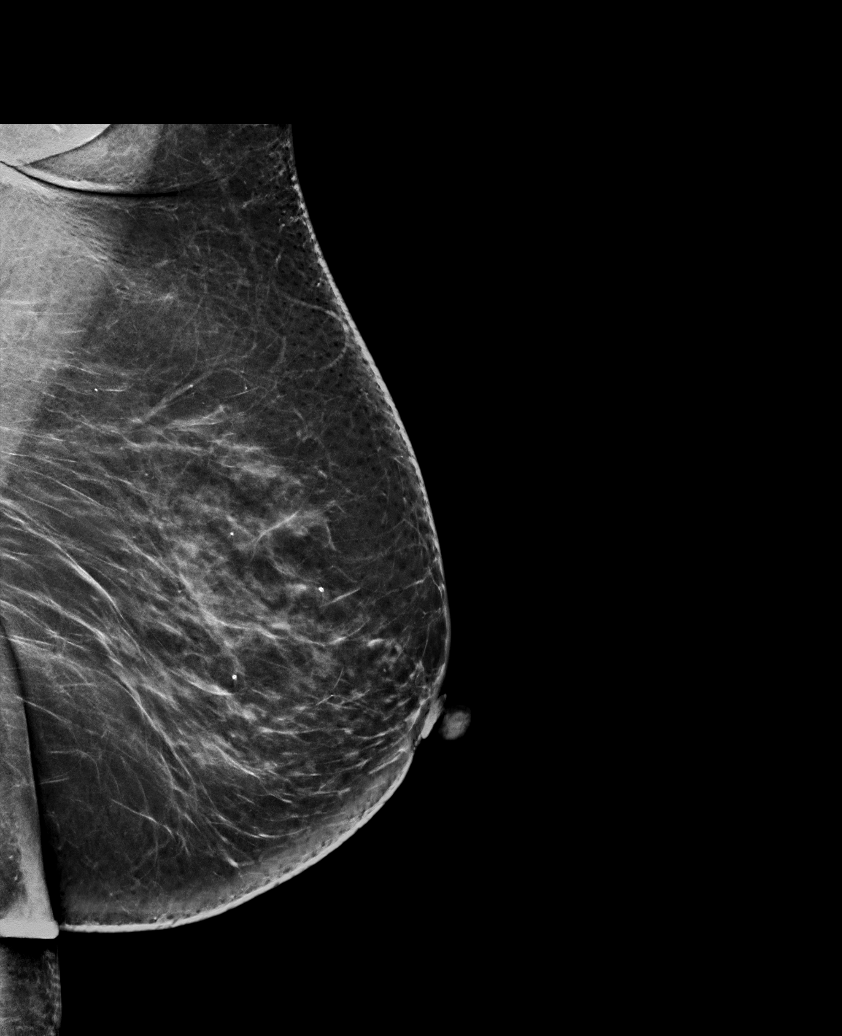

[R CC synth-2D]
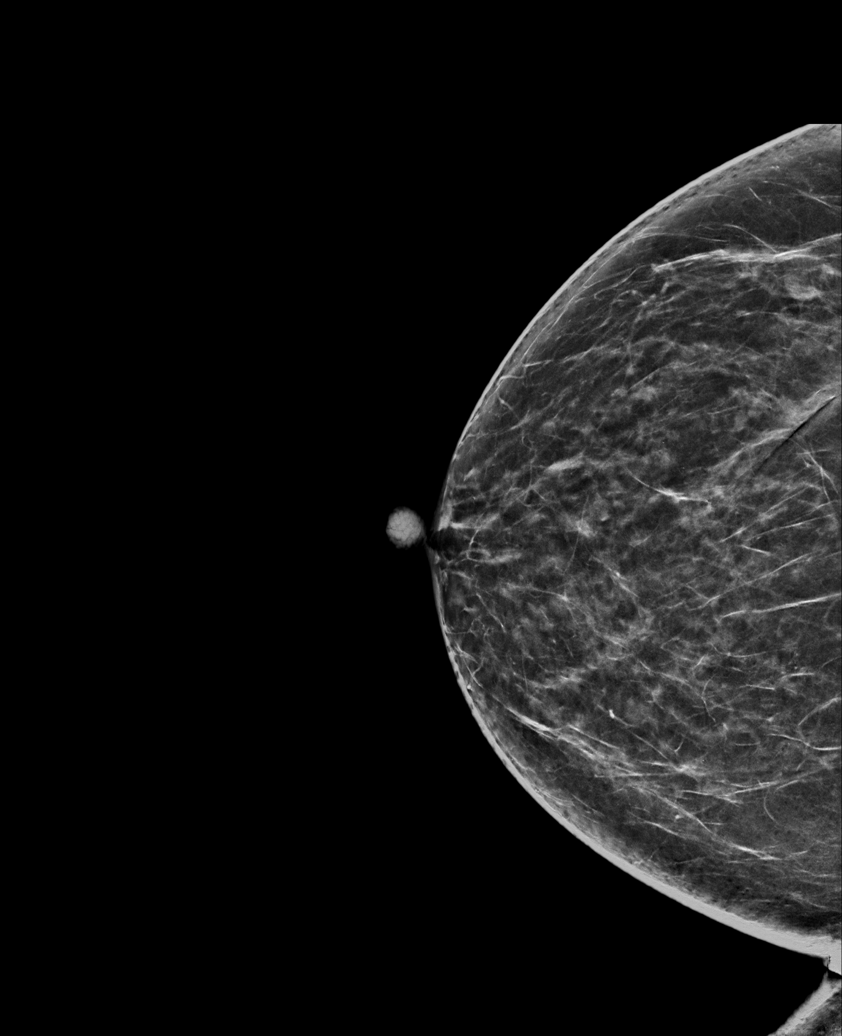

[L CC synth-2D (1 of 2)]
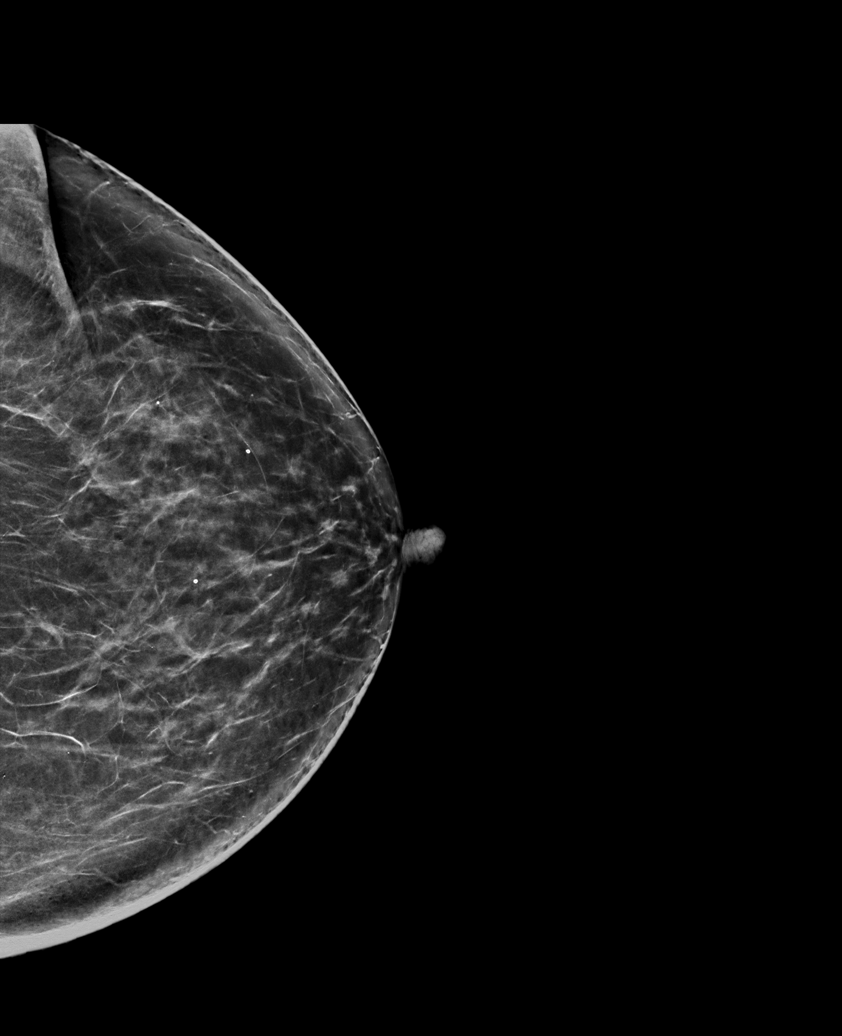

[R MLO synth-2D (1 of 2)]
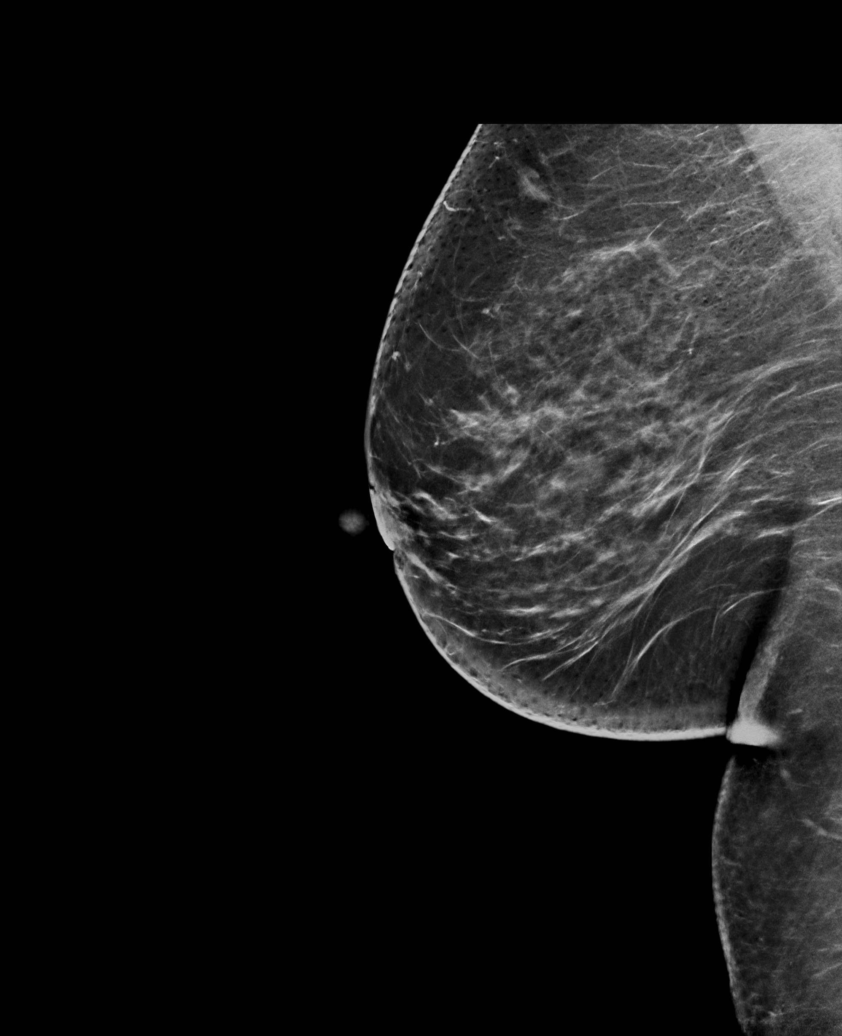

[L CC synth-2D (2 of 2)]
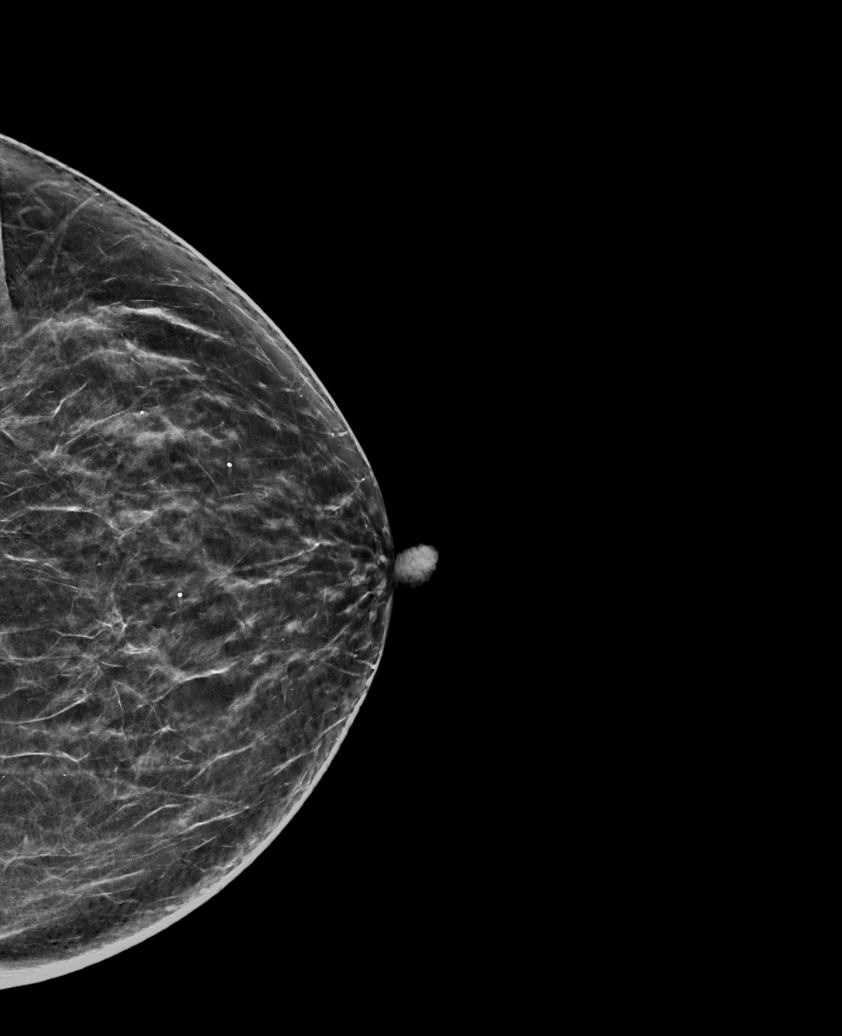

[R MLO synth-2D (2 of 2)]
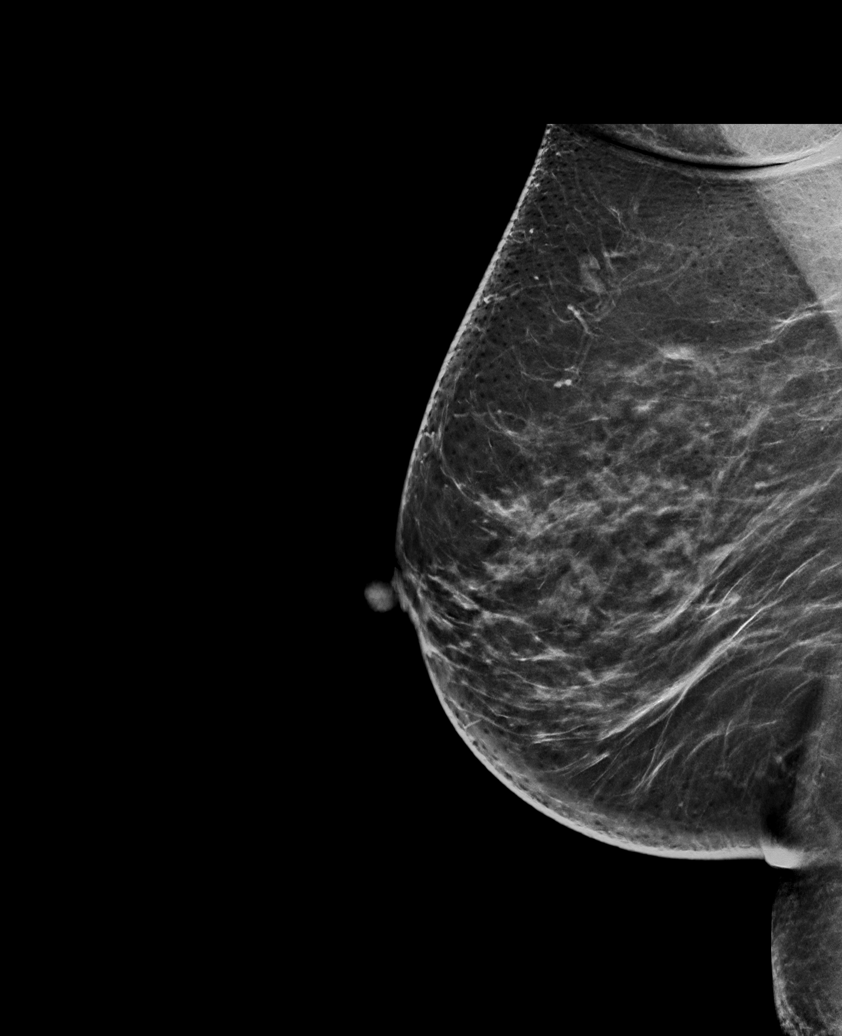

[6 of 36 positions shown; findings below may reference images not displayed]

ACR Breast Density Category b: There are scattered areas of
fibroglandular density.
FINDINGS: There are no findings suspicious for malignancy. The images were
evaluated with computer-aided detection.
IMPRESSION: No mammographic evidence of malignancy. A result letter of this
screening mammogram will be mailed directly to the patient.

RECOMMENDATION:
Screening mammogram in one year. (Code:[id number]-[id number])

BI-RADS CATEGORY  1: Negative.

## 2023-03-24 ENCOUNTER — Ambulatory Visit: Payer: BC Managed Care – PPO | Admitting: Family Medicine

## 2023-03-24 VITALS — BP 165/101 | HR 101 | Wt 211.4 lb

## 2023-03-24 DIAGNOSIS — I10 Essential (primary) hypertension: Secondary | ICD-10-CM | POA: Diagnosis not present

## 2023-03-24 DIAGNOSIS — E559 Vitamin D deficiency, unspecified: Secondary | ICD-10-CM

## 2023-03-24 DIAGNOSIS — M545 Low back pain, unspecified: Secondary | ICD-10-CM

## 2023-03-24 DIAGNOSIS — E1169 Type 2 diabetes mellitus with other specified complication: Secondary | ICD-10-CM | POA: Diagnosis not present

## 2023-03-24 DIAGNOSIS — E66811 Obesity, class 1: Secondary | ICD-10-CM

## 2023-03-24 DIAGNOSIS — E1165 Type 2 diabetes mellitus with hyperglycemia: Secondary | ICD-10-CM

## 2023-03-24 DIAGNOSIS — E669 Obesity, unspecified: Secondary | ICD-10-CM | POA: Diagnosis not present

## 2023-03-24 DIAGNOSIS — E785 Hyperlipidemia, unspecified: Secondary | ICD-10-CM

## 2023-03-24 MED ORDER — ATORVASTATIN CALCIUM 40 MG PO TABS: ORAL_TABLET | ORAL | 1 refills | Status: AC

## 2023-03-24 MED ORDER — AMLODIPINE BESYLATE 5 MG PO TABS: 5.0000 mg | ORAL_TABLET | Freq: Every day | ORAL | 1 refills | Status: AC

## 2023-03-24 MED ORDER — LISINOPRIL 20 MG PO TABS: 20.0000 mg | ORAL_TABLET | Freq: Every day | ORAL | 1 refills | Status: DC

## 2023-03-24 MED ORDER — METFORMIN HCL ER 500 MG PO TB24: ORAL_TABLET | ORAL | 1 refills | Status: DC

## 2023-03-24 MED ORDER — METOPROLOL SUCCINATE ER 50 MG PO TB24: ORAL_TABLET | ORAL | 1 refills | Status: DC

## 2023-03-24 NOTE — Progress Notes (Signed)
   Subjective:    Patient ID: Denise Hill, female    DOB: December 24, 1960, 62 y.o.   MRN: [id number]  HPI Patient arrives today for 6 month follow up. Patient states no concerns or issues.  Type 2 diabetes mellitus with hyperglycemia, without long-term current use of insulin (HCC) - Plan: Hemoglobin A1c  Hyperlipidemia associated with type 2 diabetes mellitus (HCC) - Plan: Lipid panel  Essential hypertension, benign - Plan: Basic Metabolic Panel (7), Hepatic Function Panel  Obesity (BMI 30.0-34.9)  Vitamin D deficiency - Plan: Vitamin D, 25-hydroxy  Left lumbar pain - Plan: Ambulatory referral to Orthopedics    Review of Systems     Objective:   Physical Exam General-in no acute distress Eyes-no discharge Lungs-respiratory rate normal, CTA CV-no murmurs,RRR Extremities skin warm dry no edema Neuro grossly normal Behavior normal, alert        Assessment & Plan:   1. Type 2 diabetes mellitus with hyperglycemia, without long-term current use of insulin (HCC) The patient was seen today as part of a comprehensive visit for diabetes. The importance of keeping her A1c at or below 7 range was discussed.  Discussed diet, activity, and medication compliance Emphasized healthy eating primarily with vegetables fruits and if utilizing meats lean meats such as chicken or fish grilled baked broiled Avoid sugary drinks Minimize and avoid processed foods Fit in regular physical activity preferably 25 to 30 minutes 4 times per week Standard follow-up visit recommended.  Patient aware lack of control and follow-up increases risk of diabetic complications. Regular follow-up visits Yearly ophthalmology Yearly foot exam Patient has tried Ozempic in the past had side effects May need additional medicine if her A1c is elevated - Hemoglobin A1c  2. Hyperlipidemia associated with type 2 diabetes mellitus (HCC) Goal is to get LDL below 70 reinforced the importance of taking medicine on a  regular basis - Lipid panel  3. Essential hypertension, benign Blood pressure under good control continue current measures - Basic Metabolic Panel (7) - Hepatic Function Panel  4. Obesity (BMI 30.0-34.9) Portion control regular physical activity exercise  5. Vitamin D deficiency Check vitamin D level - Vitamin D, 25-hydroxy  6. Left lumbar pain Intermittent left lower lumbar pain discomfort with sitting and with moving and walking recommend referral to back specialist - Ambulatory referral to Orthopedics Patient encouraged to quit smoking follow-up 6 months

## 2023-03-24 NOTE — Patient Instructions (Signed)
Steps to Quit Smoking Smoking tobacco is the leading cause of preventable death. It can affect almost every organ in the body. Smoking puts you and those around you at risk for developing many serious chronic diseases. Quitting smoking can be very challenging. Do not get discouraged if you are not successful the first time. Some people need to make many attempts to quit before they achieve long-term success. Do your best to stick to your quit plan, and talk with your health care provider if you have any questions or concerns. How do I get ready to quit? When you decide to quit smoking, create a plan to help you succeed. Before you quit: Pick a date to quit. Set a date within the next 2 weeks to give you time to prepare. Write down the reasons why you are quitting. Keep this list in places where you will see it often. Tell your family, friends, and co-workers that you are quitting. Support from people you are close to can make quitting easier. Talk with your health care provider about your options for quitting smoking. Find out what treatment options are covered by your health insurance. Identify people, places, things, and activities that make you want to smoke (triggers). Avoid them. What first steps can I take to quit smoking? Throw away all cigarettes at home, at work, and in your car. Throw away smoking accessories, such as Set designer. Clean your car. Make sure to empty the ashtray. Clean your home, including curtains and carpets. What strategies can I use to quit smoking? Talk with your health care provider about combining strategies, such as taking medicines while you are also receiving in-person counseling. Using these two strategies together makes you more likely to succeed in quitting than if you used either strategy on its own. If you are pregnant or breastfeeding, talk with your health care provider about finding counseling or other support strategies to quit smoking. Do not  take medicine to help you quit smoking unless your health care provider tells you to. Quit right away Quit smoking completely, instead of gradually reducing how much you smoke over a period of time. Stopping smoking right away may be more successful than gradually quitting. Attend in-person counseling to help you build problem-solving skills. You are more likely to succeed in quitting if you attend counseling sessions regularly. Even short sessions of 10 minutes can be effective. Take medicine You may take medicines to help you quit smoking. Some medicines require a prescription. You can also purchase over-the-counter medicines. Medicines may have nicotine in them to replace the nicotine in cigarettes. Medicines may: Help to stop cravings. Help to relieve withdrawal symptoms. Your health care provider may recommend: Nicotine patches, gum, or lozenges. Nicotine inhalers or sprays. Non-nicotine medicine that you take by mouth. Find resources Find resources and support systems that can help you quit smoking and remain smoke-free after you quit. These resources are most helpful when you use them often. They include: Online chats with a Veterinary surgeon. Telephone quitlines. Printed Materials engineer. Support groups or group counseling. Text messaging programs. Mobile phone apps or applications. Use apps that can help you stick to your quit plan by providing reminders, tips, and encouragement. Examples of free services include Quit Guide from the CDC and smokefree.gov  What can I do to make it easier to quit?  Reach out to your family and friends for support and encouragement. Call telephone quitlines, such as 1-800-QUIT-NOW, reach out to support groups, or work with a Veterinary surgeon for  support. Ask people who smoke to avoid smoking around you. Avoid places that trigger you to smoke, such as bars, parties, or smoke-break areas at work. Spend time with people who do not smoke. Lessen the stress in your  life. Stress can be a smoking trigger for some people. To lessen stress, try: Exercising regularly. Doing deep-breathing exercises. Doing yoga. Meditating. What benefits will I see if I quit smoking? Over time, you should start to see positive results, such as: Improved sense of smell and taste. Decreased coughing and sore throat. Slower heart rate. Lower blood pressure. Clearer and healthier skin. The ability to breathe more easily. Fewer sick days. Summary Quitting smoking can be very challenging. Do not get discouraged if you are not successful the first time. Some people need to make many attempts to quit before they achieve long-term success. When you decide to quit smoking, create a plan to help you succeed. Quit smoking right away, not slowly over a period of time. Find resources and support systems that can help you quit smoking and remain smoke-free after you quit. This information is not intended to replace advice given to you by your health care provider. Make sure you discuss any questions you have with your health care provider. Document Revised: 08/15/2021 Document Reviewed: 08/15/2021 Elsevier Patient Education  2024 [employer].

## 2023-04-07 DIAGNOSIS — M533 Sacrococcygeal disorders, not elsewhere classified: Secondary | ICD-10-CM | POA: Diagnosis not present

## 2023-04-07 DIAGNOSIS — M545 Low back pain, unspecified: Secondary | ICD-10-CM | POA: Diagnosis not present

## 2023-09-27 ENCOUNTER — Ambulatory Visit: Payer: BC Managed Care – PPO | Admitting: Family Medicine

## 2023-10-21 ENCOUNTER — Telehealth: Payer: Self-pay | Admitting: *Deleted

## 2023-10-21 NOTE — Telephone Encounter (Signed)
Patient requesting labs for upcoming appointment.

## 2023-10-21 NOTE — Telephone Encounter (Signed)
Patient was identified as falling into the [place] Measure - Diabetes.   Patient was: Appointment scheduled with primary care provider in the next 30 days.

## 2023-10-22 ENCOUNTER — Other Ambulatory Visit: Payer: Self-pay | Admitting: Family Medicine

## 2023-10-22 ENCOUNTER — Other Ambulatory Visit: Payer: Self-pay

## 2023-10-22 DIAGNOSIS — E559 Vitamin D deficiency, unspecified: Secondary | ICD-10-CM

## 2023-10-22 DIAGNOSIS — E1165 Type 2 diabetes mellitus with hyperglycemia: Secondary | ICD-10-CM

## 2023-10-22 DIAGNOSIS — E1169 Type 2 diabetes mellitus with other specified complication: Secondary | ICD-10-CM

## 2023-10-22 DIAGNOSIS — Z79899 Other long term (current) drug therapy: Secondary | ICD-10-CM

## 2023-10-22 DIAGNOSIS — I10 Essential (primary) hypertension: Secondary | ICD-10-CM

## 2023-10-22 NOTE — Telephone Encounter (Signed)
Lipid, liver, metabolic 7, A1c, urine ACR, vitamin D Diagnosis Vitamin D deficiency, diabetes, hyperlipidemia, hypertension Please let her know that we are also checking a urine to look at protein status Patient should try to be well-hydrated on the day that she goes to have her labs and urine

## 2023-10-22 NOTE — Telephone Encounter (Signed)
Attempted to call pt to inform her labs have been ordered, no answer.  Pt needs to be well hydrated for labs

## 2023-11-03 ENCOUNTER — Ambulatory Visit: Payer: BC Managed Care – PPO | Admitting: Family Medicine

## 2023-12-18 DIAGNOSIS — Z419 Encounter for procedure for purposes other than remedying health state, unspecified: Secondary | ICD-10-CM | POA: Diagnosis not present

## 2024-01-17 DIAGNOSIS — Z419 Encounter for procedure for purposes other than remedying health state, unspecified: Secondary | ICD-10-CM | POA: Diagnosis not present

## 2024-01-21 ENCOUNTER — Telehealth: Payer: Self-pay

## 2024-01-21 NOTE — Telephone Encounter (Signed)
 Copied from CRM [id number]. Topic: Clinical - Request for Lab/Test Order >> Jan 20, 2024  1:09 PM Denise Hill S wrote: Reason for CRM: pt would like to have labs done

## 2024-01-21 NOTE — Telephone Encounter (Signed)
 Called in regards to what labs did she want done, couldn't not leave message due to voicemail box being full

## 2024-01-24 ENCOUNTER — Ambulatory Visit: Payer: Self-pay

## 2024-02-17 DIAGNOSIS — Z419 Encounter for procedure for purposes other than remedying health state, unspecified: Secondary | ICD-10-CM | POA: Diagnosis not present

## 2024-02-23 ENCOUNTER — Ambulatory Visit

## 2024-03-18 DIAGNOSIS — Z419 Encounter for procedure for purposes other than remedying health state, unspecified: Secondary | ICD-10-CM | POA: Diagnosis not present

## 2024-04-18 DIAGNOSIS — Z419 Encounter for procedure for purposes other than remedying health state, unspecified: Secondary | ICD-10-CM | POA: Diagnosis not present

## 2024-04-19 ENCOUNTER — Encounter (INDEPENDENT_AMBULATORY_CARE_PROVIDER_SITE_OTHER): Payer: Self-pay | Admitting: *Deleted

## 2024-05-15 ENCOUNTER — Ambulatory Visit (INDEPENDENT_AMBULATORY_CARE_PROVIDER_SITE_OTHER)

## 2024-05-15 VITALS — BP 156/89 | HR 73 | Ht 67.0 in | Wt 206.0 lb

## 2024-05-15 DIAGNOSIS — Z23 Encounter for immunization: Secondary | ICD-10-CM | POA: Diagnosis not present

## 2024-05-15 DIAGNOSIS — Z1231 Encounter for screening mammogram for malignant neoplasm of breast: Secondary | ICD-10-CM

## 2024-05-15 DIAGNOSIS — Z720 Tobacco use: Secondary | ICD-10-CM | POA: Diagnosis not present

## 2024-05-15 DIAGNOSIS — E559 Vitamin D deficiency, unspecified: Secondary | ICD-10-CM

## 2024-05-15 DIAGNOSIS — Z1211 Encounter for screening for malignant neoplasm of colon: Secondary | ICD-10-CM

## 2024-05-15 DIAGNOSIS — E1169 Type 2 diabetes mellitus with other specified complication: Secondary | ICD-10-CM

## 2024-05-15 DIAGNOSIS — E1165 Type 2 diabetes mellitus with hyperglycemia: Secondary | ICD-10-CM | POA: Diagnosis not present

## 2024-05-15 DIAGNOSIS — E785 Hyperlipidemia, unspecified: Secondary | ICD-10-CM | POA: Diagnosis not present

## 2024-05-15 DIAGNOSIS — I10 Essential (primary) hypertension: Secondary | ICD-10-CM

## 2024-05-15 MED ORDER — METFORMIN HCL ER 500 MG PO TB24: 1000.0000 mg | ORAL_TABLET | Freq: Every day | ORAL | 3 refills | Status: DC

## 2024-05-15 MED ORDER — LISINOPRIL 20 MG PO TABS
20.0000 mg | ORAL_TABLET | Freq: Every day | ORAL | 3 refills | Status: AC
Start: 2024-05-15 — End: ?

## 2024-05-15 MED ORDER — VARENICLINE TARTRATE (STARTER) 0.5 MG X 11 & 1 MG X 42 PO TBPK
ORAL_TABLET | ORAL | 0 refills | Status: DC
Start: 2024-05-15 — End: 2024-06-19

## 2024-05-15 MED ORDER — METOPROLOL SUCCINATE ER 50 MG PO TB24: 50.0000 mg | ORAL_TABLET | Freq: Every day | ORAL | 3 refills | Status: AC

## 2024-05-15 NOTE — Assessment & Plan Note (Signed)
 Check fasting labs today.  Takes atorvastatin  40 mg.  No refills needed at this time.

## 2024-05-15 NOTE — Progress Notes (Signed)
 New Patient Office Visit  Subjective    Patient ID: Denise Hill, female    DOB: 12-05-1960  Age: 63 y.o. MRN: [id number]  CC:  Chief Complaint  Patient presents with   Establish Care    Pt here to establish care and is out of her BP meds     HPI Denise Hill presents to establish care  Discussed the use of AI scribe software for clinical note transcription with the patient, who gave verbal consent to proceed.  History of Present Illness    Denise Hill is a 63 year old female with hypertension and diabetes who presents for establishment of care and medication refills.  Hypertension - Blood pressure slightly elevated today - Lisinopril  required for blood pressure control; ran out of medication on Friday and needs a refill  Tobacco use and smoking cessation - Currently smokes - Interested in starting Chantix  for smoking cessation. - She has taken this in the past - Last quit attempt was several years ago  Diabetes mellitus and neuropathy symptoms - Diabetes managed with metformin ; takes two pills daily - Denies hypoglycemic episodes.   Preventive health maintenance - Due for mammogram and colonoscopy     Outpatient Encounter Medications as of 05/15/2024  Medication Sig   amLODipine  (NORVASC ) 5 MG tablet Take 1 tablet (5 mg total) by mouth daily. For BP   atorvastatin  (LIPITOR) 40 MG tablet Take one tablet po each day   glucose blood (CONTOUR NEXT TEST) test strip Tests sugar once a day- E11.9   [DISCONTINUED] lisinopril  (ZESTRIL ) 20 MG tablet Take 1 tablet (20 mg total) by mouth daily.   [DISCONTINUED] metFORMIN  (GLUCOPHAGE -XR) 500 MG 24 hr tablet 1 to 2 qam as directed for diabetes   [DISCONTINUED] metoprolol  succinate (TOPROL  XL) 50 MG 24 hr tablet Take one tablet po daily   [DISCONTINUED] Varenicline  Tartrate, Starter, (CHANTIX  STARTING MONTH PAK) 0.5 MG X 11 & 1 MG X 42 TBPK Take one 0.5 mg tablet by mouth once daily for 3 days, then increase to one 0.5 mg  tablet twice daily for 4 days, then increase to one 1 mg tablet twice daily   lisinopril  (ZESTRIL ) 20 MG tablet Take 1 tablet (20 mg total) by mouth daily.   metFORMIN  (GLUCOPHAGE -XR) 500 MG 24 hr tablet Take 2 tablets (1,000 mg total) by mouth daily with breakfast. 1 to 2 qam as directed for diabetes   metoprolol  succinate (TOPROL  XL) 50 MG 24 hr tablet Take 1 tablet (50 mg total) by mouth daily. Take one tablet po daily   Varenicline  Tartrate, Starter, (CHANTIX  STARTING MONTH PAK) 0.5 MG X 11 & 1 MG X 42 TBPK Take one 0.5 mg tablet by mouth once daily for 3 days, then increase to one 0.5 mg tablet twice daily for 4 days, then increase to one 1 mg tablet twice daily   [DISCONTINUED] Cholecalciferol (VITAMIN D ) 50 MCG (2000 UT) CAPS Take by mouth. (Patient not taking: Reported on 05/15/2024)   [DISCONTINUED] varenicline  (CHANTIX  CONTINUING MONTH PAK) 1 MG tablet Take 1 tablet (1 mg total) by mouth 2 (two) times daily.   No facility-administered encounter medications on file as of 05/15/2024.    Past Medical History:  Diagnosis Date   Aneurysm (HCC) 2002   coil / clip  [facility]   Chronic headaches    Diabetes mellitus without complication [facility])    History of cerebral aneurysm 01/03/2021   Follows with specialist   Hypercholesteremia    Hypertension  Past Surgical History:  Procedure Laterality Date   APPENDECTOMY  1980   CEREBRAL ANEURYSM REPAIR  2002   [facility]   CERVIX SURGERY  1990   Freezing of cervical cells   CHOLECYSTECTOMY  01/18/2012   Procedure: LAPAROSCOPIC CHOLECYSTECTOMY;  Surgeon: Thresa JAYSON Pulling, MD;  Location: [facility] ORS;  Service: General;  Laterality: N/A;   COLONOSCOPY N/A 05/03/2014   Procedure: COLONOSCOPY;  Surgeon: Claudis RAYMOND Rivet, MD;  Location: [facility] ENDO SUITE;  Service: Endoscopy;  Laterality: N/A;  830   DILATION AND CURETTAGE OF UTERUS     SALPINGOOPHORECTOMY  1980   Right side removal   TUBAL LIGATION  1990;1992    Family History  Problem  Relation Age of Onset   Hypertension Mother    Diabetes Mother    Hyperlipidemia Mother    Hypertension Sister    Hypertension Sister    Colon cancer Neg Hx     Social History   Socioeconomic History   Marital status: Divorced    Spouse name: Not on file   Number of children: Not on file   Years of education: Not on file   Highest education level: Not on file  Occupational History   Not on file  Tobacco Use   Smoking status: Every Day    Current packs/day: 0.50    Average packs/day: 0.5 packs/day for 38.0 years (19.0 ttl pk-yrs)    Types: Cigarettes   Smokeless tobacco: Never  Vaping Use   Vaping status: Never Used  Substance and Sexual Activity   Alcohol use: No   Drug use: No   Sexual activity: Not Currently    Birth control/protection: Surgical  Other Topics Concern   Not on file  Social History Narrative   Not on file   Social Drivers of Health   Financial Resource Strain: Not on file  Food Insecurity: No Food Insecurity (05/15/2024)   Hunger Vital Sign    Worried About Running Out of Food in the Last Year: Never true    Ran Out of Food in the Last Year: Never true  Transportation Needs: No Transportation Needs (05/15/2024)   PRAPARE - Administrator, Civil Service (Medical): No    Lack of Transportation (Non-Medical): No  Physical Activity: Not on file  Stress: Not on file  Social Connections: Not on file  Intimate Partner Violence: Not At Risk (05/15/2024)   Humiliation, Afraid, Rape, and Kick questionnaire    Fear of Current or Ex-Partner: No    Emotionally Abused: No    Physically Abused: No    Sexually Abused: No    ROS      Objective    BP (!) 156/89   Pulse 73   Ht 5' 7 (1.702 m)   Wt 206 lb (93.4 kg)   LMP 01/15/2012   SpO2 97%   BMI 32.26 kg/m   Physical Exam Vitals and nursing note reviewed.  Constitutional:      Appearance: Normal appearance. She is obese.  HENT:     Head: Normocephalic.     Right Ear: Tympanic  membrane, ear canal and external ear normal.     Left Ear: Tympanic membrane, ear canal and external ear normal.     Nose: Nose normal.     Mouth/Throat:     Mouth: Mucous membranes are Hill.     Pharynx: Oropharynx is clear.  Eyes:     Extraocular Movements: Extraocular movements intact.     Pupils: Pupils are equal,  round, and reactive to light.  Cardiovascular:     Rate and Rhythm: Normal rate and regular rhythm.  Pulmonary:     Effort: Pulmonary effort is normal.     Breath sounds: Normal breath sounds.  Musculoskeletal:     Cervical back: Normal range of motion and neck supple.  Skin:    General: Skin is warm and dry.  Neurological:     Mental Status: She is alert and oriented to person, place, and time.  Psychiatric:        Mood and Affect: Mood normal.        Thought Content: Thought content normal.         Assessment & Plan:   Problem List Items Addressed This Visit       Cardiovascular and Mediastinum   Essential hypertension, benign - Primary   Elevated in office today, but has been out of lisinopril  for 2 days.  Refills provided.  Recommend low-sodium diet and regular exercise for 150 minutes/week.      Relevant Medications   lisinopril  (ZESTRIL ) 20 MG tablet   metoprolol  succinate (TOPROL  XL) 50 MG 24 hr tablet   Other Relevant Orders   CMP14+EGFR     Endocrine   Type 2 diabetes mellitus (HCC)   Check fasting labs today.  Metformin  refilled.  Follow-up according to lab results.      Relevant Medications   lisinopril  (ZESTRIL ) 20 MG tablet   metFORMIN  (GLUCOPHAGE -XR) 500 MG 24 hr tablet   Other Relevant Orders   CMP14+EGFR   HgB A1c   Urine Microalbumin w/creat. ratio   Hyperlipidemia associated with type 2 diabetes mellitus (HCC)   Check fasting labs today.  Takes atorvastatin  40 mg.  No refills needed at this time.      Relevant Medications   lisinopril  (ZESTRIL ) 20 MG tablet   metFORMIN  (GLUCOPHAGE -XR) 500 MG 24 hr tablet   metoprolol   succinate (TOPROL  XL) 50 MG 24 hr tablet   Other Relevant Orders   CMP14+EGFR   Lipid Profile     Other   Vitamin D  deficiency   Recheck vitamin D  levels.  Currently not on supplementation.  Follow-up according to lab results.      Relevant Orders   Vitamin D  (25 hydroxy)   Tobacco use   She has quit with Chantix  in the past, but unfortunately started back smoking.  Will add Chantix  again for smoking cessation. Smoking cessation instruction/counseling given:  counseled patient on the dangers of tobacco use, advised patient to stop smoking, and reviewed strategies to maximize success. Three minutes spent on smoking cessation.        Relevant Medications   Varenicline  Tartrate, Starter, (CHANTIX  STARTING MONTH PAK) 0.5 MG X 11 & 1 MG X 42 TBPK   Other Visit Diagnoses       Encounter for screening mammogram for malignant neoplasm of breast       Relevant Orders   MM 3D SCREENING MAMMOGRAM BILATERAL BREAST     Screening for colon cancer       Relevant Orders   Ambulatory referral to Gastroenterology     Need for influenza vaccination       Relevant Orders   Flu vaccine trivalent PF, 6mos and older(Flulaval,Afluria,Fluarix,Fluzone) (Completed)            Return in about 6 months (around 11/12/2024) for chronic follow-up with PCP.   Leita Longs, FNP

## 2024-05-15 NOTE — Assessment & Plan Note (Signed)
 Recheck vitamin D  levels.  Currently not on supplementation.  Follow-up according to lab results.

## 2024-05-15 NOTE — Assessment & Plan Note (Signed)
 Elevated in office today, but has been out of lisinopril  for 2 days.  Refills provided.  Recommend low-sodium diet and regular exercise for 150 minutes/week.

## 2024-05-15 NOTE — Assessment & Plan Note (Addendum)
 She has quit with Chantix  in the past, but unfortunately started back smoking.  Will add Chantix  again for smoking cessation. Smoking cessation instruction/counseling given:  counseled patient on the dangers of tobacco use, advised patient to stop smoking, and reviewed strategies to maximize success. Three minutes spent on smoking cessation.

## 2024-05-15 NOTE — Assessment & Plan Note (Signed)
 Check fasting labs today.  Metformin  refilled.  Follow-up according to lab results.

## 2024-05-16 ENCOUNTER — Encounter (INDEPENDENT_AMBULATORY_CARE_PROVIDER_SITE_OTHER): Payer: Self-pay | Admitting: *Deleted

## 2024-05-16 LAB — CMP14+EGFR
ALT: 11 IU/L (ref 0–32)
AST: 17 IU/L (ref 0–40)
Albumin: 4.4 g/dL (ref 3.9–4.9)
Alkaline Phosphatase: 97 IU/L (ref 44–121)
BUN/Creatinine Ratio: 7 — ABNORMAL LOW (ref 12–28)
BUN: 5 mg/dL — ABNORMAL LOW (ref 8–27)
Bilirubin Total: 0.4 mg/dL (ref 0.0–1.2)
CO2: 26 mmol/L (ref 20–29)
Calcium: 9.7 mg/dL (ref 8.7–10.3)
Chloride: 100 mmol/L (ref 96–106)
Creatinine, Ser: 0.73 mg/dL (ref 0.57–1.00)
Globulin, Total: 2.9 g/dL (ref 1.5–4.5)
Glucose: 267 mg/dL — ABNORMAL HIGH (ref 70–99)
Potassium: 4 mmol/L (ref 3.5–5.2)
Sodium: 139 mmol/L (ref 134–144)
Total Protein: 7.3 g/dL (ref 6.0–8.5)
eGFR: 94 mL/min/1.73 (ref >59)

## 2024-05-16 LAB — LIPID PANEL
Chol/HDL Ratio: 4.7 ratio — ABNORMAL HIGH (ref 0.0–4.4)
Cholesterol, Total: 233 mg/dL — ABNORMAL HIGH (ref 100–199)
HDL: 50 mg/dL (ref >39)
LDL Chol Calc (NIH): 147 mg/dL — ABNORMAL HIGH (ref 0–99)
Triglycerides: 202 mg/dL — ABNORMAL HIGH (ref 0–149)
VLDL Cholesterol Cal: 36 mg/dL (ref 5–40)

## 2024-05-16 LAB — MICROALBUMIN / CREATININE URINE RATIO
Creatinine, Urine: 204.8 mg/dL
Microalb/Creat Ratio: 6 mg/g{creat} (ref 0–29)
Microalbumin, Urine: 13.3 ug/mL

## 2024-05-16 LAB — HEMOGLOBIN A1C
Est. average glucose Bld gHb Est-mCnc: 269 mg/dL
Hgb A1c MFr Bld: 11 % — ABNORMAL HIGH (ref 4.8–5.6)

## 2024-05-16 LAB — VITAMIN D 25 HYDROXY (VIT D DEFICIENCY, FRACTURES): Vit D, 25-Hydroxy: 15.3 ng/mL — ABNORMAL LOW (ref 30.0–100.0)

## 2024-05-19 DIAGNOSIS — Z419 Encounter for procedure for purposes other than remedying health state, unspecified: Secondary | ICD-10-CM | POA: Diagnosis not present

## 2024-05-31 ENCOUNTER — Other Ambulatory Visit (HOSPITAL_COMMUNITY): Payer: Self-pay

## 2024-05-31 DIAGNOSIS — Z1231 Encounter for screening mammogram for malignant neoplasm of breast: Secondary | ICD-10-CM

## 2024-06-05 ENCOUNTER — Ambulatory Visit (HOSPITAL_COMMUNITY)
Admission: RE | Admit: 2024-06-05 | Discharge: 2024-06-05 | Disposition: A | Discharge status: Home / Self Care | Source: Ambulatory Visit

## 2024-06-05 ENCOUNTER — Encounter (HOSPITAL_COMMUNITY): Payer: Self-pay

## 2024-06-05 DIAGNOSIS — Z1231 Encounter for screening mammogram for malignant neoplasm of breast: Secondary | ICD-10-CM | POA: Diagnosis not present

## 2024-06-16 ENCOUNTER — Other Ambulatory Visit: Payer: Self-pay

## 2024-06-16 DIAGNOSIS — Z720 Tobacco use: Secondary | ICD-10-CM

## 2024-06-30 ENCOUNTER — Ambulatory Visit: Payer: Self-pay

## 2024-06-30 ENCOUNTER — Other Ambulatory Visit: Payer: Self-pay

## 2024-06-30 DIAGNOSIS — E1165 Type 2 diabetes mellitus with hyperglycemia: Secondary | ICD-10-CM

## 2024-06-30 MED ORDER — GLIPIZIDE ER 5 MG PO TB24: 5.0000 mg | ORAL_TABLET | Freq: Every day | ORAL | 1 refills | Status: AC

## 2024-06-30 MED ORDER — METFORMIN HCL ER 500 MG PO TB24: 1000.0000 mg | ORAL_TABLET | Freq: Two times a day (BID) | ORAL | 1 refills | Status: AC

## 2024-07-19 DIAGNOSIS — Z419 Encounter for procedure for purposes other than remedying health state, unspecified: Secondary | ICD-10-CM | POA: Diagnosis not present

## 2024-11-13 ENCOUNTER — Ambulatory Visit
